# Patient Record
Sex: Male | Born: 1935 | Race: White | Hispanic: No | Smoking: Current every day smoker
Health system: Southern US, Community
[De-identification: ages and names within clinical notes are randomized; demographics above are authoritative.]

## PROBLEM LIST (undated history)

## (undated) DIAGNOSIS — H1710 Central corneal opacity, unspecified eye: Secondary | ICD-10-CM

## (undated) DIAGNOSIS — C719 Malignant neoplasm of brain, unspecified: Secondary | ICD-10-CM

## (undated) DIAGNOSIS — Z9001 Acquired absence of eye: Secondary | ICD-10-CM

## (undated) DIAGNOSIS — C801 Malignant (primary) neoplasm, unspecified: Secondary | ICD-10-CM

## (undated) DIAGNOSIS — N289 Disorder of kidney and ureter, unspecified: Secondary | ICD-10-CM

## (undated) DIAGNOSIS — Z978 Presence of other specified devices: Secondary | ICD-10-CM

## (undated) DIAGNOSIS — M436 Torticollis: Secondary | ICD-10-CM

## (undated) DIAGNOSIS — Z960 Presence of urogenital implants: Secondary | ICD-10-CM

## (undated) DIAGNOSIS — R531 Weakness: Secondary | ICD-10-CM

## (undated) DIAGNOSIS — I739 Peripheral vascular disease, unspecified: Secondary | ICD-10-CM

## (undated) DIAGNOSIS — I10 Essential (primary) hypertension: Secondary | ICD-10-CM

## (undated) DIAGNOSIS — R278 Other lack of coordination: Secondary | ICD-10-CM

## (undated) DIAGNOSIS — E7800 Pure hypercholesterolemia, unspecified: Secondary | ICD-10-CM

## (undated) DIAGNOSIS — I2510 Atherosclerotic heart disease of native coronary artery without angina pectoris: Secondary | ICD-10-CM

## (undated) DIAGNOSIS — I701 Atherosclerosis of renal artery: Secondary | ICD-10-CM

## (undated) HISTORY — DX: Other lack of coordination: R27.8

## (undated) HISTORY — DX: Atherosclerosis of renal artery: I70.1

## (undated) HISTORY — DX: Atherosclerotic heart disease of native coronary artery without angina pectoris: I25.10

## (undated) HISTORY — DX: Central corneal opacity, unspecified eye: H17.10

## (undated) HISTORY — DX: Acquired absence of eye: Z90.01

## (undated) HISTORY — DX: Pure hypercholesterolemia, unspecified: E78.00

## (undated) HISTORY — DX: Malignant neoplasm of brain, unspecified: C71.9

## (undated) HISTORY — DX: Essential (primary) hypertension: I10

## (undated) HISTORY — PX: OTHER SURGICAL HISTORY: SHX169

## (undated) HISTORY — DX: Weakness: R53.1

## (undated) HISTORY — DX: Torticollis: M43.6

## (undated) HISTORY — PX: CORONARY STENT PLACEMENT: SHX1402

## (undated) HISTORY — DX: Disorder of kidney and ureter, unspecified: N28.9

## (undated) HISTORY — DX: Peripheral vascular disease, unspecified: I73.9

## (undated) HISTORY — DX: Malignant (primary) neoplasm, unspecified: C80.1

## (undated) HISTORY — PX: HEMORROIDECTOMY: SUR656

---

## 2005-08-21 ENCOUNTER — Ambulatory Visit: Payer: Self-pay | Admitting: Cardiovascular Disease

## 2005-08-21 ENCOUNTER — Inpatient Hospital Stay (HOSPITAL_COMMUNITY): Admission: EM | Admit: 2005-08-21 | Discharge: 2005-08-24 | Payer: Self-pay | Admitting: Emergency Medicine

## 2005-09-13 ENCOUNTER — Ambulatory Visit: Payer: Self-pay | Admitting: Internal Medicine

## 2006-04-24 ENCOUNTER — Ambulatory Visit: Payer: Self-pay | Admitting: Cardiology

## 2006-11-19 ENCOUNTER — Ambulatory Visit: Payer: Self-pay | Admitting: Cardiology

## 2006-11-19 LAB — CONVERTED CEMR LAB
ALT: 20 units/L (ref 0–40)
Albumin: 4 g/dL (ref 3.5–5.2)
Alkaline Phosphatase: 59 units/L (ref 39–117)
BUN: 28 mg/dL — ABNORMAL HIGH (ref 6–23)
Bilirubin, Direct: 0.1 mg/dL (ref 0.0–0.3)
CO2: 36 meq/L — ABNORMAL HIGH (ref 19–32)
Calcium: 10.6 mg/dL — ABNORMAL HIGH (ref 8.4–10.5)
GFR calc non Af Amer: 40 mL/min
Hemoglobin, Urine: NEGATIVE
Ketones, ur: NEGATIVE mg/dL
Nitrite: NEGATIVE
Potassium: 4.2 meq/L (ref 3.5–5.1)
Specific Gravity, Urine: 1.02 (ref 1.000–1.03)
Total Protein: 6.7 g/dL (ref 6.0–8.3)
Urine Glucose: NEGATIVE mg/dL
Urobilinogen, UA: 0.2 (ref 0.0–1.0)

## 2006-12-03 ENCOUNTER — Ambulatory Visit: Payer: Self-pay

## 2006-12-19 ENCOUNTER — Ambulatory Visit: Payer: Self-pay | Admitting: Cardiology

## 2006-12-19 LAB — CONVERTED CEMR LAB
Basophils Relative: 0.1 % (ref 0.0–1.0)
Calcium: 9.9 mg/dL (ref 8.4–10.5)
Chloride: 106 meq/L (ref 96–112)
Creatinine, Ser: 1.8 mg/dL — ABNORMAL HIGH (ref 0.4–1.5)
Glucose, Bld: 99 mg/dL (ref 70–99)
Monocytes Absolute: 0.7 10*3/uL (ref 0.2–0.7)
Neutrophils Relative %: 63.3 % (ref 43.0–77.0)
Potassium: 4 meq/L (ref 3.5–5.1)
Prothrombin Time: 11.3 s (ref 10.0–14.0)
Sodium: 145 meq/L (ref 135–145)
WBC: 6.8 10*3/uL (ref 4.5–10.5)

## 2006-12-24 ENCOUNTER — Ambulatory Visit: Payer: Self-pay | Admitting: Cardiology

## 2006-12-24 ENCOUNTER — Ambulatory Visit (HOSPITAL_COMMUNITY): Admission: RE | Admit: 2006-12-24 | Discharge: 2006-12-25 | Payer: Self-pay | Admitting: Cardiology

## 2007-01-05 ENCOUNTER — Ambulatory Visit: Payer: Self-pay

## 2007-01-05 ENCOUNTER — Ambulatory Visit: Payer: Self-pay | Admitting: Cardiology

## 2007-01-05 LAB — CONVERTED CEMR LAB
CO2: 29 meq/L (ref 19–32)
Calcium: 10.3 mg/dL (ref 8.4–10.5)
GFR calc Af Amer: 52 mL/min
GFR calc non Af Amer: 43 mL/min
Glucose, Bld: 125 mg/dL — ABNORMAL HIGH (ref 70–99)

## 2007-04-06 ENCOUNTER — Ambulatory Visit: Payer: Self-pay | Admitting: Internal Medicine

## 2007-05-15 ENCOUNTER — Ambulatory Visit: Payer: Self-pay | Admitting: Internal Medicine

## 2007-07-01 ENCOUNTER — Ambulatory Visit: Payer: Self-pay | Admitting: Internal Medicine

## 2007-09-14 ENCOUNTER — Ambulatory Visit: Payer: Self-pay

## 2008-09-22 ENCOUNTER — Ambulatory Visit: Payer: Self-pay

## 2008-10-13 ENCOUNTER — Ambulatory Visit: Payer: Self-pay | Admitting: Internal Medicine

## 2009-05-03 ENCOUNTER — Ambulatory Visit: Payer: Self-pay | Admitting: Internal Medicine

## 2009-05-03 DIAGNOSIS — F17200 Nicotine dependence, unspecified, uncomplicated: Secondary | ICD-10-CM | POA: Insufficient documentation

## 2009-05-03 DIAGNOSIS — E782 Mixed hyperlipidemia: Secondary | ICD-10-CM | POA: Insufficient documentation

## 2009-05-03 DIAGNOSIS — E785 Hyperlipidemia, unspecified: Secondary | ICD-10-CM

## 2009-05-03 DIAGNOSIS — I2510 Atherosclerotic heart disease of native coronary artery without angina pectoris: Secondary | ICD-10-CM

## 2009-05-03 DIAGNOSIS — I10 Essential (primary) hypertension: Secondary | ICD-10-CM

## 2009-05-12 ENCOUNTER — Telehealth (INDEPENDENT_AMBULATORY_CARE_PROVIDER_SITE_OTHER): Payer: Self-pay | Admitting: *Deleted

## 2009-06-02 ENCOUNTER — Encounter: Payer: Self-pay | Admitting: Internal Medicine

## 2009-06-02 LAB — CONVERTED CEMR LAB
ALT: 19 units/L
Albumin: 4.1 g/dL
BUN: 30 mg/dL
Calcium: 10 mg/dL
Creatinine, Ser: 1.74 mg/dL
GFR calc non Af Amer: 39 mL/min
HDL: 43 mg/dL
Potassium: 3.7 meq/L
Total Bilirubin: 0.3 mg/dL
Total Protein: 6.1 g/dL

## 2009-06-08 ENCOUNTER — Encounter: Payer: Self-pay | Admitting: Internal Medicine

## 2009-09-25 ENCOUNTER — Encounter: Payer: Self-pay | Admitting: Internal Medicine

## 2009-09-25 DIAGNOSIS — I714 Abdominal aortic aneurysm, without rupture, unspecified: Secondary | ICD-10-CM | POA: Insufficient documentation

## 2009-09-26 ENCOUNTER — Ambulatory Visit: Payer: Self-pay

## 2009-09-26 ENCOUNTER — Encounter: Payer: Self-pay | Admitting: Internal Medicine

## 2009-12-19 ENCOUNTER — Ambulatory Visit: Payer: Self-pay | Admitting: Internal Medicine

## 2009-12-19 DIAGNOSIS — R930 Abnormal findings on diagnostic imaging of skull and head, not elsewhere classified: Secondary | ICD-10-CM | POA: Insufficient documentation

## 2010-01-03 ENCOUNTER — Telehealth: Payer: Self-pay | Admitting: Internal Medicine

## 2010-01-11 ENCOUNTER — Encounter: Payer: Self-pay | Admitting: Internal Medicine

## 2010-01-15 ENCOUNTER — Ambulatory Visit: Payer: Self-pay | Admitting: Internal Medicine

## 2010-01-16 LAB — CONVERTED CEMR LAB
BUN: 35 mg/dL — ABNORMAL HIGH (ref 6–23)
CO2: 26 meq/L (ref 19–32)
Calcium: 10.3 mg/dL (ref 8.4–10.5)
Chloride: 104 meq/L (ref 96–112)
Creatinine, Ser: 1.7 mg/dL — ABNORMAL HIGH (ref 0.40–1.50)
Glucose, Bld: 132 mg/dL — ABNORMAL HIGH (ref 70–99)
Sodium: 141 meq/L (ref 135–145)

## 2010-01-17 ENCOUNTER — Encounter: Payer: Self-pay | Admitting: Internal Medicine

## 2010-01-23 ENCOUNTER — Telehealth: Payer: Self-pay | Admitting: Internal Medicine

## 2010-01-24 ENCOUNTER — Ambulatory Visit: Payer: Self-pay | Admitting: Internal Medicine

## 2010-01-31 ENCOUNTER — Encounter: Payer: Self-pay | Admitting: Internal Medicine

## 2010-01-31 LAB — COMPREHENSIVE METABOLIC PANEL
ALT: 11 U/L (ref 0–53)
Albumin: 4.1 g/dL (ref 3.5–5.2)
CO2: 30 mEq/L (ref 19–32)
Glucose, Bld: 71 mg/dL (ref 70–99)
Potassium: 4 mEq/L (ref 3.5–5.3)
Sodium: 144 mEq/L (ref 135–145)
Total Bilirubin: 0.6 mg/dL (ref 0.3–1.2)
Total Protein: 6.5 g/dL (ref 6.0–8.3)

## 2010-01-31 LAB — CBC WITH DIFFERENTIAL/PLATELET
BASO%: 0.3 % (ref 0.0–2.0)
Eosinophils Absolute: 0.5 10*3/uL (ref 0.0–0.5)
LYMPH%: 18.3 % (ref 14.0–49.0)
MCHC: 34 g/dL (ref 32.0–36.0)
MONO#: 0.8 10*3/uL (ref 0.1–0.9)
NEUT#: 4.8 10*3/uL (ref 1.5–6.5)
Platelets: 193 10*3/uL (ref 140–400)
RBC: 4.39 10*6/uL (ref 4.20–5.82)
RDW: 13.8 % (ref 11.0–14.6)
WBC: 7.4 10*3/uL (ref 4.0–10.3)
lymph#: 1.3 10*3/uL (ref 0.9–3.3)

## 2010-02-04 ENCOUNTER — Encounter: Admission: RE | Admit: 2010-02-04 | Discharge: 2010-02-04 | Payer: Self-pay | Admitting: Internal Medicine

## 2010-02-07 ENCOUNTER — Ambulatory Visit (HOSPITAL_COMMUNITY): Admission: RE | Admit: 2010-02-07 | Discharge: 2010-02-07 | Payer: Self-pay | Admitting: Internal Medicine

## 2010-02-12 ENCOUNTER — Encounter: Payer: Self-pay | Admitting: Internal Medicine

## 2010-02-12 LAB — CBC WITH DIFFERENTIAL/PLATELET
BASO%: 0.2 % (ref 0.0–2.0)
LYMPH%: 20.2 % (ref 14.0–49.0)
MCH: 32.3 pg (ref 27.2–33.4)
MCHC: 34.2 g/dL (ref 32.0–36.0)
MCV: 94.5 fL (ref 79.3–98.0)
MONO%: 9 % (ref 0.0–14.0)
NEUT%: 64.6 % (ref 39.0–75.0)
Platelets: 206 10*3/uL (ref 140–400)
RBC: 4.3 10*6/uL (ref 4.20–5.82)

## 2010-02-12 LAB — COMPREHENSIVE METABOLIC PANEL
ALT: 13 U/L (ref 0–53)
Alkaline Phosphatase: 57 U/L (ref 39–117)
Creatinine, Ser: 1.86 mg/dL — ABNORMAL HIGH (ref 0.40–1.50)
Sodium: 141 mEq/L (ref 135–145)
Total Bilirubin: 0.7 mg/dL (ref 0.3–1.2)
Total Protein: 6.8 g/dL (ref 6.0–8.3)

## 2010-02-21 ENCOUNTER — Ambulatory Visit: Payer: Self-pay | Admitting: Thoracic Surgery

## 2010-02-22 ENCOUNTER — Telehealth: Payer: Self-pay | Admitting: Internal Medicine

## 2010-02-26 ENCOUNTER — Ambulatory Visit: Admission: RE | Admit: 2010-02-26 | Discharge: 2010-02-26 | Payer: Self-pay | Admitting: Family Medicine

## 2010-02-27 ENCOUNTER — Ambulatory Visit: Payer: Self-pay | Admitting: Thoracic Surgery (Cardiothoracic Vascular Surgery)

## 2010-02-27 ENCOUNTER — Encounter: Payer: Self-pay | Admitting: Internal Medicine

## 2010-04-10 ENCOUNTER — Ambulatory Visit: Payer: Self-pay | Admitting: Thoracic Surgery (Cardiothoracic Vascular Surgery)

## 2010-04-10 ENCOUNTER — Inpatient Hospital Stay (HOSPITAL_COMMUNITY)
Admission: RE | Admit: 2010-04-10 | Discharge: 2010-04-23 | Payer: Self-pay | Admitting: Thoracic Surgery (Cardiothoracic Vascular Surgery)

## 2010-04-10 ENCOUNTER — Encounter: Payer: Self-pay | Admitting: Thoracic Surgery (Cardiothoracic Vascular Surgery)

## 2010-04-10 HISTORY — PX: BRONCHOSCOPY: SUR163

## 2010-04-10 HISTORY — PX: OTHER SURGICAL HISTORY: SHX169

## 2010-04-13 ENCOUNTER — Encounter: Payer: Self-pay | Admitting: Internal Medicine

## 2010-05-17 ENCOUNTER — Ambulatory Visit: Payer: Self-pay | Admitting: Thoracic Surgery (Cardiothoracic Vascular Surgery)

## 2010-05-17 ENCOUNTER — Encounter: Payer: Self-pay | Admitting: Internal Medicine

## 2010-05-17 ENCOUNTER — Encounter
Admission: RE | Admit: 2010-05-17 | Discharge: 2010-05-17 | Payer: Self-pay | Admitting: Thoracic Surgery (Cardiothoracic Vascular Surgery)

## 2010-06-07 ENCOUNTER — Ambulatory Visit: Payer: Self-pay | Admitting: Internal Medicine

## 2010-07-16 ENCOUNTER — Ambulatory Visit: Payer: Self-pay | Admitting: Thoracic Surgery (Cardiothoracic Vascular Surgery)

## 2010-07-16 ENCOUNTER — Encounter
Admission: RE | Admit: 2010-07-16 | Discharge: 2010-07-16 | Payer: Self-pay | Admitting: Thoracic Surgery (Cardiothoracic Vascular Surgery)

## 2010-09-10 ENCOUNTER — Telehealth: Payer: Self-pay | Admitting: Internal Medicine

## 2010-09-22 ENCOUNTER — Other Ambulatory Visit: Payer: Self-pay | Admitting: Thoracic Surgery (Cardiothoracic Vascular Surgery)

## 2010-09-22 DIAGNOSIS — C3490 Malignant neoplasm of unspecified part of unspecified bronchus or lung: Secondary | ICD-10-CM

## 2010-09-24 ENCOUNTER — Encounter: Payer: Self-pay | Admitting: Internal Medicine

## 2010-09-25 ENCOUNTER — Ambulatory Visit: Admission: RE | Admit: 2010-09-25 | Discharge: 2010-09-25 | Payer: Self-pay | Source: Home / Self Care

## 2010-09-25 ENCOUNTER — Encounter: Payer: Self-pay | Admitting: Internal Medicine

## 2010-10-02 NOTE — Letter (Signed)
Summary: [facility]  [facility]   Imported By: Marylou Mccoy 04/16/2010 10:18:03  _____________________________________________________________________  External Attachment:    Type:   Image     Comment:   External Document

## 2010-10-02 NOTE — Progress Notes (Signed)
Summary: Phone Call   Phone Note Call from Patient Call back at Home Phone [phone]   Caller: Patient Call For: rn Summary of Call: Patient requested that Dr. Prescott Gum RN give him a call. Initial call taken by: [place],  Jan 03, 2010 3:17 PM  Follow-up for Phone Call        Attempted TCB pt.  LMOM TCB. Cloyde Reams RN  Jan 03, 2010 3:48 PM  Called spoke with pt.  Pt is still awaiting phone call from Dr Gala Romney reguarding chest CT.  Was told he would call and discuss results and recommendations with pt, but pt hasn't heard from Dr Gala Romney.  Please call pt.  Thanks. Follow-up by: Cloyde Reams RN,  Jan 05, 2010 12:32 PM  Additional Follow-up for Phone Call Additional follow up Details #1::        Dr Gala Romney discussed with pt.  See append 12/19/09 chest image. Additional Follow-up by: Cloyde Reams RN,  Jan 08, 2010 2:24 PM

## 2010-10-02 NOTE — Letter (Signed)
Summary: [facility] Office Visit   [facility] Office Visit   Imported By: Roderic Ovens 06/26/2010 14:16:57  _____________________________________________________________________  External Attachment:    Type:   Image     Comment:   External Document

## 2010-10-02 NOTE — Progress Notes (Signed)
Summary: Message   Phone Note Call from Patient Call back at Home Phone [phone]   Caller: Patient Call For: Ajahnae Rathgeber Summary of Call: Mr. Petruzzi would like for Dr. Gala Romney to call him today, he would not give me any details about what it was concerning.  He said that it was very important that Dr. Gala Romney call him sometime today. Initial call taken by: [place],  February 22, 2010 9:42 AM  Follow-up for Phone Call        I will call him now  Dolores Patty, MD, [facility]  February 22, 2010 6:02 PM

## 2010-10-02 NOTE — Miscellaneous (Signed)
Summary: Orders Update  Clinical Lists Changes  Problems: Added new problem of ABDOMINAL AORTIC ANEURYSM (ICD-441.4) Orders: Added new Test order of Abdominal Aorta Duplex (Abd Aorta Duplex) - Signed 

## 2010-10-02 NOTE — Miscellaneous (Signed)
Summary: Orders Update  Clinical Lists Changes  Orders: Added new Referral order of CT Scan  (CT Scan) - Signed  Appended Document: Orders Update    Clinical Lists Changes  Orders: Added new Referral order of CT Scan  (CT Scan) - Signed

## 2010-10-02 NOTE — Progress Notes (Signed)
Summary: CT SCAN RESULTS  Phone Note Call from Patient   Caller: Patient Call For: Bensimhon Summary of Call: Pt calling requesting results of CT scan done on 01/17/10 and scanned into EMR.  Results in chart unsigned.  Pt would like a call back this afternoon after 4pm with the results.  Advised pt I would send a not to Dr Gala Romney and I would call pt back with results.   Initial call taken by: Cloyde Reams RN,  Jan 23, 2010 12:28 PM  Follow-up for Phone Call        I spoke with him today and told him he has lung cancer which looks like it is spreading. has agreed to go see oncology. He would like [place] to make him appt with them on a mon or wed so his daughter can go with him. Cicero Duck can you please arrange and get back to him with appt? Thanks. Dolores Patty, MD, [facility]  Jan 23, 2010 5:59 PM   Additional Follow-up for Phone Call Additional follow up Details #1::        Called [facility] and spoke with Renee new pt coordinator.  She requested records be faxed to [phone] and she will have Dr Gwenyth Bouillon review, and then will contact pt with appt date and time.  Faxed last OV note, CXR and CT reports, as well as noted on fax cover sheet pt needs appt on Mon or Wed so daughter can be present at OV. Additional Follow-up by: Cloyde Reams RN,  Jan 24, 2010 11:37 AM    Additional Follow-up for Phone Call Additional follow up Details #2::    Consult appt with Dr Arbutus Ped on 01/31/10 at 1:00.  Pt aware. Follow-up by: Cloyde Reams RN,  Jan 26, 2010 9:40 AM

## 2010-10-02 NOTE — Letter (Signed)
[facility]   Imported By: Roderic Ovens 04/24/2010 10:20:59  _____________________________________________________________________  External Attachment:    Type:   Image     Comment:   External Document

## 2010-10-02 NOTE — Letter (Signed)
[facility]   Imported By: Marylou Mccoy 03/21/2010 18:30:04  _____________________________________________________________________  External Attachment:    Type:   Image     Comment:   External Document

## 2010-10-02 NOTE — Consult Note (Signed)
Summary: [facility] New Patient Evaluation  [facility] New [facility]   Imported By: Roderic Ovens 02/23/2010 10:20:08  _____________________________________________________________________  External Attachment:    Type:   Image     Comment:   External Document

## 2010-10-02 NOTE — Assessment & Plan Note (Signed)
Summary: F6M/AMD  Medications Added CARDURA 1 MG TABS (DOXAZOSIN MESYLATE) 1 tab at bedtime      Allergies Added:   Visit Type:  Follow-up Referring Provider:  Dr. Arvilla Meres Primary Provider:  Dewaine Oats, MD  CC:  6 month checkup/No complaint.  History of Present Illness: [name] is a very pleasant 76 year old male with a history of coronary artery disease, status post Cypher drug-eluting stent of the left circumflex in December 2006.  He also has hypertension, renal artery stenosis, status post renal artery stent in April 2008, COPD with ongoing tobacco use, and protocol as he returns today for routine followup.   In September 2010 had CXR which showed a 4.5 cm RUL opacity which was concerning for malignancy. We ordered CT but he cancelled.  Overall doing fairly well. Denies CP or significant dyspnea. No weight loss or night sweats. No hempotysis. Has been taking BP at home with systolics 140-150 range. Says he is afraid to eat stuff he likes b/c he is worried it will drive his BP yet.  Still smoking about 2-3 packs/week.  Current Medications (verified): 1)  Simvastatin 40 Mg Tabs (Simvastatin) .... Take One Tablet By Mouth Daily At Bedtime 2)  Quinapril Hcl 40 Mg Tabs (Quinapril Hcl) .Marland Kitchen.. 1 By Mouth Two Times A Day 3)  Amlodipine Besylate 10 Mg Tabs (Amlodipine Besylate) .... Take One Tablet By Mouth Daily 4)  Hydrochlorothiazide 25 Mg Tabs (Hydrochlorothiazide) .... Take One Tablet By Mouth Daily. 5)  Tekturna 300 Mg Tabs (Aliskiren Fumarate) .Marland Kitchen.. 1 By Mouth Once Daily 6)  Plavix 75 Mg Tabs (Clopidogrel Bisulfate) .... Take One Tablet By Mouth Daily 7)  Carvedilol 25 Mg Tabs (Carvedilol) .... Take One Tablet By Mouth Twice A Day 8)  Aspirin 81 Mg Tbec (Aspirin) .... Take One Tablet By Mouth Daily 9)  Clonidine Hcl 0.3 Mg Tabs (Clonidine Hcl) .Marland Kitchen.. 1 By Mouth Two Times A Day  Allergies (verified): 1)  ! Sulfa  Past History:  Past Medical History:  1. Coronary disease      --s/p Cypher drug eluting stent to LCX for unstable angina in 2006.  2. Renal artery stenosis        --s/p L renal artery stent 4/08  3. COPD with ongoing tobacco abuse.  4. Hypertension.  5. Hypercholesterolemia.  6. Renal insufficiency. Baseline Cr 1.5-1.8  7. Lower extremity peripheral arterial disease.  8. 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  9. Torticollis 10. RUL opacity on CXR 9/10 - refused CT  Social History: Married. Smokes at least 4 to 5 cigarettes  per day, as he has for more than 40 years.  Denies alcohol and illicit drug use.  Review of Systems       As per HPI and past medical history; otherwise all systems negative.   Vital Signs:  Patient profile:   76 year old male Height:      70 inches Weight:      129 pounds BMI:     18.58 Pulse rate:   83 / minute BP sitting:   170 / 98  (left arm) Cuff size:   large  Vitals Entered By: Hardin Negus, RMA (December 19, 2009 3:08 PM)  Serial Vital Signs/Assessments:  Time      Position  BP       Pulse  Resp  Temp     By 3:18                192/109  Charlena Cross, RN, BSN   Physical Exam  General:  He is very thin and in no acute distress.  He ambulates in the clinic without any respiratory difficulty. HEENT:  Normal except for his torticollis. NECK:  Supple.  No JVD.  Carotids are distant 1+ bilaterally without any obvious bruits.  No lymphadenopathy or thyromegaly.   CARDIAC:  PMI is nondisplaced.  He is regular with an S4.  No murmur. LUNGS:  Decreased air movement throughout, but otherwise clear. ABDOMEN:  Soft, nontender, and nondistended.  No hepatosplenomegaly.  No bruits.  No masses. EXTREMITIES:  Warm with no cyanosis, clubbing, or edema. NEURO:  Alert and oriented x3.  Cranial nerves are intact except for his torticollis.  He moves all 4 extremities without difficulty.  Affect is pleasant.    Impression & Recommendations:  Problem # 1:  CAD, NATIVE VESSEL  (ICD-414.01)  Stable. No evidence of ischemia. Continue current regimen.  His updated medication list for this problem includes:    Quinapril Hcl 40 Mg Tabs (Quinapril hcl) .Marland Kitchen... 1 by mouth two times a day    Amlodipine Besylate 10 Mg Tabs (Amlodipine besylate) .Marland Kitchen... Take one tablet by mouth daily    Plavix 75 Mg Tabs (Clopidogrel bisulfate) .Marland Kitchen... Take one tablet by mouth daily    Carvedilol 25 Mg Tabs (Carvedilol) .Marland Kitchen... Take one tablet by mouth twice a day    Aspirin 81 Mg Tbec (Aspirin) .Marland Kitchen... Take one tablet by mouth daily  Problem # 2:  ABNORMAL CHEST X-RAY (ICD-793.1) Very suspicious for cancer. Not very interesed in CT at this time. Will recheck CXR. If still abnormal will try to push him to f/u with CT.  Problem # 3:  HYPERTENSION, BENIGN (ICD-401.1) SBP well over 200 on presentation today. Came down to 170. Will start cardura 1mg  at bedtime.repeat renal u/s to make sure renal aretery stent is patent.  Problem # 4:  TOBACCO ABUSE (ICD-305.1) Counseled on need to quit.  Problem # 5:  ABDOMINAL AORTIC ANEURYSM (ICD-441.4) Will do f/u ultrasound to measure.   Other Orders: Abdominal Aorta Duplex (Abd Aorta Duplex) T-2 View CXR (71020TC)  Patient Instructions: 1)  Your physician recommends that you schedule a follow-up appointment in: 6 months 2)  Your physician has recommended you make the following change in your medication: start cardura 1 mg at bedtime 3)  A chest x-ray takes a picture of the organs and structures inside the chest, including the heart, lungs, and blood vessels. This test can show several things, including, whether the heart is enlarged; whether fluid is building up in the lungs; and whether pacemaker / defibrillator leads are still in place. 4)  Your physician has requested that you have an abdominal aorta duplex. During this test, an ultrasound is used to evaluate the aorta. Allow 30 minutes for this exam. Do not eat after midnight the day before and avoid  carbonated beverages. There are no restrictions or special instructions. Prescriptions: CARDURA 1 MG TABS (DOXAZOSIN MESYLATE) 1 tab at bedtime  #30 x 6   Entered by:   Charlena Cross, RN, BSN   Authorized by:   Dolores Patty, MD, [facility]   Signed by:   Charlena Cross, RN, BSN on 12/19/2009   Method used:   Electronically to        [facility]* (retail)       [address]       [place], [place]  [address]       Ph: [phone]  Fax: [phone]   RxID:   [id number]

## 2010-10-04 ENCOUNTER — Ambulatory Visit: Payer: Self-pay | Admitting: Thoracic Surgery (Cardiothoracic Vascular Surgery)

## 2010-10-04 ENCOUNTER — Ambulatory Visit: Admit: 2010-10-04 | Payer: Self-pay | Admitting: Thoracic Surgery (Cardiothoracic Vascular Surgery)

## 2010-10-04 ENCOUNTER — Other Ambulatory Visit: Payer: Self-pay

## 2010-10-04 NOTE — Progress Notes (Signed)
Summary: pt calling re order for ultrasound   Phone Note Call from Patient   Caller: Patient [phone] Reason for Call: Talk to Nurse Summary of Call: pt calling about having an [place] -he has them every year Initial call taken by: Glynda Jaeger,  September 10, 2010 9:25 AM  Follow-up for Phone Call        [facility] will contact pt to sch Meredith Staggers, RN  September 10, 2010 9:38 AM

## 2010-10-04 NOTE — Miscellaneous (Signed)
Summary: Orders Update  Clinical Lists Changes  Orders: Added new Test order of Abdominal Aorta Duplex (Abd Aorta Duplex) - Signed 

## 2010-10-10 ENCOUNTER — Encounter: Payer: Self-pay | Admitting: Internal Medicine

## 2010-10-18 ENCOUNTER — Ambulatory Visit
Admission: RE | Admit: 2010-10-18 | Discharge: 2010-10-18 | Disposition: A | Discharge status: Home / Self Care | Payer: Medicare Other | Source: Ambulatory Visit | Attending: Thoracic Surgery (Cardiothoracic Vascular Surgery) | Admitting: Thoracic Surgery (Cardiothoracic Vascular Surgery)

## 2010-10-18 ENCOUNTER — Ambulatory Visit (INDEPENDENT_AMBULATORY_CARE_PROVIDER_SITE_OTHER): Payer: Medicare Other | Admitting: Thoracic Surgery (Cardiothoracic Vascular Surgery)

## 2010-10-18 DIAGNOSIS — C3490 Malignant neoplasm of unspecified part of unspecified bronchus or lung: Secondary | ICD-10-CM

## 2010-10-18 DIAGNOSIS — D381 Neoplasm of uncertain behavior of trachea, bronchus and lung: Secondary | ICD-10-CM

## 2010-10-18 NOTE — Miscellaneous (Signed)
  Clinical Lists Changes  Orders: Added new Test order of Abdominal Aorta Duplex (Abd Aorta Duplex) - Signed

## 2010-10-19 NOTE — Assessment & Plan Note (Addendum)
OFFICE VISIT  GLENDA, KUNST DOB:  1934/09/18                                        October 18, 2010 CHART #:  [id number]  REASON FOR VISIT:  A 23-month follow-up after resection for lung cancer.  HISTORY OF PRESENT ILLNESS:  The patient is a 76 year old gentleman who had a right upper lobe mass 4.5 cm in diameter.  He underwent a right upper lobectomy, mediastinal node dissection in August 2011.  This turned out to be a stage IB cancer.  He was offered adjuvant chemotherapy but declined.  He was last seen in the office in November, at which time, he was doing well, now returns for 51-month followup with a CT scan.  He states he has really been feeling well.  He has not had any cough or hemoptysis.  His breathing is stable.  He has not had seen Dr. Gala Romney recently regarding any heart issues.  Overall, he feels well.  MEDICATIONS:  Unchanged from his last visit.  ALLERGIES:  SULFA.  No change in family or social history.  PHYSICAL EXAMINATION:  General:  The patient is a thin 76 year old gentleman in no acute distress.  Vital Signs:  Blood pressure 145/86, pulse 84, respirations are 20, ox saturation is 97% on room air.  His general appearance is remarkable for torticollis.  There is no cervical or supraclavicular adenopathy.  LUNGS:  Distant breath sounds bilaterally.  Incisions are well healed.  CT of the chest was reviewed.  It has not been officially read yet and is compared to his PET scan from June 2008.  In comparing the 2 films, the ground-glass opacity in the left upper lobe is questionably increased in size.  It may be a difference in technique.  It had previously measured 11.8 mm on the PET scan, but it is like now approximately 13 mm; otherwise, has not changed in character.  IMPRESSION:  The patient is now 6 months out from right upper lobectomy and node dissection for a stage IB non-small cell cancer.  He has no evidence of  recurrent disease.  He does have a ground-glass opacity. There were actually 3 of them, the largest in the left upper lobe that were present prior to his surgery and were not hypermetabolic on PET scan.  There has been questionable enlargement of the lesion in the left upper lobe.  I will wait and see what the official radiology reading is; however, even if it has increased in size, it has been relatively minimal, it appear to be relatively slow growing.  I recommended to him that we repeat a scan in 3 months to see if there has been any definitive change in size.  If there is then we could discuss diagnostic options.  Salvatore Decent Dorris Fetch, M.D. Electronically Signed  SCH/MEDQ  D:  10/18/2010  T:  10/19/2010  Job:  [id number]  cc:   Bevelyn Buckles. Bensimhon, MD Dewaine Oats

## 2010-11-16 LAB — COMPREHENSIVE METABOLIC PANEL
AST: 17 U/L (ref 0–37)
AST: 24 U/L (ref 0–37)
Albumin: 3.8 g/dL (ref 3.5–5.2)
BUN: 23 mg/dL (ref 6–23)
CO2: 27 mEq/L (ref 19–32)
Calcium: 9.1 mg/dL (ref 8.4–10.5)
Chloride: 102 mEq/L (ref 96–112)
Creatinine, Ser: 1.77 mg/dL — ABNORMAL HIGH (ref 0.4–1.5)
Creatinine, Ser: 1.83 mg/dL — ABNORMAL HIGH (ref 0.4–1.5)
GFR calc Af Amer: 44 mL/min — ABNORMAL LOW (ref >60)
GFR calc Af Amer: 46 mL/min — ABNORMAL LOW (ref >60)
GFR calc non Af Amer: 37 mL/min — ABNORMAL LOW (ref >60)
Glucose, Bld: 130 mg/dL — ABNORMAL HIGH (ref 70–99)
Total Bilirubin: 0.6 mg/dL (ref 0.3–1.2)
Total Bilirubin: 0.7 mg/dL (ref 0.3–1.2)

## 2010-11-16 LAB — URINALYSIS, ROUTINE W REFLEX MICROSCOPIC
Bilirubin Urine: NEGATIVE
Hgb urine dipstick: NEGATIVE
Protein, ur: NEGATIVE mg/dL
Urobilinogen, UA: 0.2 mg/dL (ref 0.0–1.0)

## 2010-11-16 LAB — CBC
HCT: 29.9 % — ABNORMAL LOW (ref 39.0–52.0)
HCT: 34.6 % — ABNORMAL LOW (ref 39.0–52.0)
Hemoglobin: 10.2 g/dL — ABNORMAL LOW (ref 13.0–17.0)
Hemoglobin: 10.7 g/dL — ABNORMAL LOW (ref 13.0–17.0)
Hemoglobin: 12 g/dL — ABNORMAL LOW (ref 13.0–17.0)
MCH: 31.3 pg (ref 26.0–34.0)
MCH: 31.6 pg (ref 26.0–34.0)
MCH: 31.8 pg (ref 26.0–34.0)
MCH: 32 pg (ref 26.0–34.0)
MCHC: 34.1 g/dL (ref 30.0–36.0)
MCHC: 34.5 g/dL (ref 30.0–36.0)
MCHC: 35.3 g/dL (ref 30.0–36.0)
MCV: 89.9 fL (ref 78.0–100.0)
MCV: 90.3 fL (ref 78.0–100.0)
MCV: 91.6 fL (ref 78.0–100.0)
MCV: 91.7 fL (ref 78.0–100.0)
Platelets: 198 10*3/uL (ref 150–400)
Platelets: 211 10*3/uL (ref 150–400)
Platelets: 221 10*3/uL (ref 150–400)
RBC: 3.1 MIL/uL — ABNORMAL LOW (ref 4.22–5.81)
RBC: 3.26 MIL/uL — ABNORMAL LOW (ref 4.22–5.81)
RBC: 4.44 MIL/uL (ref 4.22–5.81)
RDW: 13.4 % (ref 11.5–15.5)
RDW: 13.5 % (ref 11.5–15.5)
RDW: 13.6 % (ref 11.5–15.5)
WBC: 6.7 10*3/uL (ref 4.0–10.5)
WBC: 8.2 10*3/uL (ref 4.0–10.5)
WBC: 8.9 10*3/uL (ref 4.0–10.5)

## 2010-11-16 LAB — BASIC METABOLIC PANEL
BUN: 24 mg/dL — ABNORMAL HIGH (ref 6–23)
BUN: 36 mg/dL — ABNORMAL HIGH (ref 6–23)
BUN: 37 mg/dL — ABNORMAL HIGH (ref 6–23)
BUN: 39 mg/dL — ABNORMAL HIGH (ref 6–23)
BUN: 42 mg/dL — ABNORMAL HIGH (ref 6–23)
CO2: 26 mEq/L (ref 19–32)
CO2: 27 mEq/L (ref 19–32)
CO2: 28 mEq/L (ref 19–32)
Calcium: 10.5 mg/dL (ref 8.4–10.5)
Calcium: 9.8 mg/dL (ref 8.4–10.5)
Chloride: 102 mEq/L (ref 96–112)
Chloride: 103 mEq/L (ref 96–112)
Chloride: 103 mEq/L (ref 96–112)
Chloride: 103 mEq/L (ref 96–112)
Chloride: 104 mEq/L (ref 96–112)
Chloride: 105 mEq/L (ref 96–112)
Creatinine, Ser: 1.68 mg/dL — ABNORMAL HIGH (ref 0.4–1.5)
Creatinine, Ser: 1.81 mg/dL — ABNORMAL HIGH (ref 0.4–1.5)
Creatinine, Ser: 1.83 mg/dL — ABNORMAL HIGH (ref 0.4–1.5)
Creatinine, Ser: 1.91 mg/dL — ABNORMAL HIGH (ref 0.4–1.5)
GFR calc Af Amer: 44 mL/min — ABNORMAL LOW (ref >60)
GFR calc Af Amer: 45 mL/min — ABNORMAL LOW (ref >60)
GFR calc Af Amer: 46 mL/min — ABNORMAL LOW (ref >60)
GFR calc non Af Amer: 37 mL/min — ABNORMAL LOW (ref >60)
GFR calc non Af Amer: 38 mL/min — ABNORMAL LOW (ref >60)
Glucose, Bld: 113 mg/dL — ABNORMAL HIGH (ref 70–99)
Glucose, Bld: 121 mg/dL — ABNORMAL HIGH (ref 70–99)
Glucose, Bld: 133 mg/dL — ABNORMAL HIGH (ref 70–99)
Glucose, Bld: 134 mg/dL — ABNORMAL HIGH (ref 70–99)
Potassium: 3 mEq/L — ABNORMAL LOW (ref 3.5–5.1)
Potassium: 4 mEq/L (ref 3.5–5.1)
Potassium: 4 mEq/L (ref 3.5–5.1)
Potassium: 4.4 mEq/L (ref 3.5–5.1)
Sodium: 137 mEq/L (ref 135–145)
Sodium: 138 mEq/L (ref 135–145)

## 2010-11-16 LAB — BLOOD GAS, ARTERIAL
Acid-Base Excess: 3.4 mmol/L — ABNORMAL HIGH (ref 0.0–2.0)
Bicarbonate: 27.1 mEq/L — ABNORMAL HIGH (ref 20.0–24.0)
Drawn by: 1181601
FIO2: 0.21 %
O2 Saturation: 97 %
O2 Saturation: 97.5 %
pCO2 arterial: 40.8 mmHg (ref 35.0–45.0)
pH, Arterial: 7.438 (ref 7.350–7.450)
pO2, Arterial: 79.7 mmHg — ABNORMAL LOW (ref 80.0–100.0)
pO2, Arterial: 89.2 mmHg (ref 80.0–100.0)

## 2010-11-16 LAB — SURGICAL PCR SCREEN: Staphylococcus aureus: NEGATIVE

## 2010-11-16 LAB — TYPE AND SCREEN
ABO/RH(D): A POS
Antibody Screen: NEGATIVE

## 2010-11-16 LAB — FUNGUS CULTURE W SMEAR

## 2010-11-16 LAB — APTT: aPTT: 31 seconds (ref 24–37)

## 2010-11-19 LAB — GLUCOSE, CAPILLARY: Glucose-Capillary: 125 mg/dL — ABNORMAL HIGH (ref 70–99)

## 2010-12-07 ENCOUNTER — Other Ambulatory Visit: Payer: Self-pay | Admitting: Thoracic Surgery (Cardiothoracic Vascular Surgery)

## 2010-12-07 DIAGNOSIS — J984 Other disorders of lung: Secondary | ICD-10-CM

## 2011-01-15 NOTE — Assessment & Plan Note (Signed)
[facility] OFFICE NOTE   Rodney, Lynch                       MRN:          [id number]  DATE:07/01/2007                            DOB:          07-13-1935    PRIMARY CARE PHYSICIAN:  Dewaine Oats, MD.   INTERVAL HISTORY:  [name] is a very pleasant, 76 year old male with  a history of coronary artery disease status post Cypher drug-eluting  stenting of the left circumflex in December 2006. He also has  hypertension, renal artery stenosis status post renal artery stent in  April 2008, COPD with ongoing tobacco use and torticollis. He returns  today for routine followup.   He is doing quite well. He denies any chest pain or shortness of breath.  He has been keeping a good track of his blood pressures and systolics  having been averaging in the 100-120 range. He denies any orthopnea or  PND, no lower extremity edema. He cut his cigarette use down to 3 packs  per week.   CURRENT MEDICATIONS:  1. Aspirin 325 a day.  2. Accupril 40 b.i.d.  3. Norvasc 10 a day.  4. Plavix 75 a day.  5. HCTZ 25 a day.  6. Simvastatin 40 a day.  7. Coreg 25 b.i.d.  8. Tekturna 300 a day.  9. Clonidine 0.2 b.i.d.   PHYSICAL EXAMINATION:  GENERAL:  He is in no acute distress. He  ambulates around the clinic without any respiratory difficulty.  VITAL SIGNS:  Blood pressure here is 150/90, heart rate 78, weight is  139.  HEENT:  Normal except for his torticollis.  NECK:  Supple. There is no JVD. Carotids are distant but 1+ bilaterally  without any obvious bruits. No lymphadenopathy or thyromegaly.  CARDIAC:  PMI is nondisplaced. He has distant heart sounds, regular rate  and rhythm with an S4, no murmur.  LUNGS:  Clear with decreased air movement throughout.  ABDOMEN:  Soft, nontender, nondistended. No hepatosplenomegaly, no  bruits, no masses, good bowel sounds.  EXTREMITIES:  Warm with no cyanosis, clubbing or edema.  No ulceration.  NEUROLOGIC:  Alert and oriented x3. Cranial nerves II-XII are intact  except for his torticollis. He moves all 4 extremities without  difficulty. Affect is pleasant.   ASSESSMENT/PLAN:  1. Hypertension. Blood pressure is elevated here but well controlled      at home. Will just continue current therapy.  2. Coronary artery disease status post previous stenting. This is      stable, no evidence of ongoing ischemia.  3. Hyperlipidemia. Most recent LDL was at goal of 47. Continue statin.  4. Tobacco use. We once again discussed the need to quit.     Bevelyn Buckles. Bensimhon, MD  Electronically Signed    DRB/MedQ  DD: 07/01/2007  DT: 07/01/2007  Job #: [id number]   cc:   Dewaine Oats

## 2011-01-15 NOTE — Assessment & Plan Note (Signed)
[facility] OFFICE NOTE   [name], [name]                       MRN:          [id number]  DATE:05/15/2007                            DOB:          1935/04/10    PRIMARY CARE PHYSICIAN:  Dr. Dewaine Oats.   INTERVAL HISTORY:  Mr. Arneson is a very pleasant 76 year old male with  a history of coronary artery disease, status post stenting of his left  circumflex with a CYPHER drug-eluting stent in December 2006.  He also  has severe hypertension, renal artery stenosis status post renal artery  stent April 2008, COPD with ongoing tobacco use and torticollis.  He  returns today for a routine followup.   Since we last saw him, he had an ultrasound of his kidneys which showed  a widely patent renal artery stent.  We did increase his clonidine at  the last visit.  He said it has made him feel weak and it has been a bit  hard to tolerate but he has been compliant with it.  When he takes his  blood pressure at home it is usually in the 135 range.  He denies chest  pain or shortness of breath.  He is still fairly active.  He continues  to smoke 3 packs of cigarettes a week.   CURRENT MEDICATIONS:  1. Aspirin 325.  2. Accupril 40 b.i.d.  3. Norvasc 10 a day.  4. Plavix 75 a day.  5. HCTZ 25 a day.  6. Simvastatin 40 a day.  7. Coreg 25 a day.  8. Tekturna 300 a day.  9. Clonidine 0.2 b.i.d.   PHYSICAL EXAMINATION:  VITAL SIGNS:  Blood pressure 140/78, heart rate  76, weight 135.  GENERAL:  He ambulates around the clinic without any respiratory  difficulty.  He is in no acute distress.  HEENT:  He has significant torticollis.  He also has previous right eye  enucleation with implant, otherwise normal.  NECK:  There is no JVD.  Carotids are 2 plus bilaterally without bruits.  There is no lymphadenopathy or thyromegaly.  LUNGS:  Clear.  CARDIAC:  PMI is nondisplaced.  He has a regular rate and rhythm with an  S4.   No murmur.  ABDOMEN:  Soft, nontender, nondistended.  No hepatosplenomegaly.  No  bruits.  No masses.  Good bowel sounds.  EXTREMITIES:  Warm with no  cyanosis, clubbing, or edema.  No ulceration.  NEUROLOGIC:  He is alert and oriented x3.  Cranial nerves II-XII are  intact, except for his torticollis.  He moves all 4 extremities without  difficulty.  Affect is pleasant.  SKIN:  He has multiple tattoos.   ASSESSMENT/PLAN:  1. Coronary artery disease.  This is quite stable without any evidence      of ongoing ischemia.  Continue current therapy.  2. Hypertension.  Blood pressure is still a bit elevated but he is      somewhat symptomatic from [place] bringing it down.  We will continue to      let him adjust.  I have asked him  to keep a blood pressure log from      home and we will see him back in one month.  3. Hyperlipidemia.  The most recent LDL was at goal at 47, HDL was 41.      Continue Statin.  4. Tobacco use.  Once again, I reminded him of the critical importance      of him quitting.   DISPOSITION:  We will see him back in 1 month to reassess his  hypertension.     Bevelyn Buckles. Bensimhon, MD  Electronically Signed    DRB/MedQ  DD: 05/15/2007  DT: 05/15/2007  Job #: [id number]

## 2011-01-15 NOTE — Assessment & Plan Note (Signed)
[facility] OFFICE NOTE   [name], [name]                       MRN:          [id number]  DATE:04/06/2007                            DOB:          Aug 29, 1935    PRIMARY CARE PHYSICIAN:  [name], M.D.   INTERVAL HISTORY:  [name] is a very pleasant 76 year old male with  a very pleasant 76 year old male with a history of coronary artery  disease, status post stenting of his left circumflex with a Sypher drug  eluting stent in December 2006, severe hypertension, renal artery  stenosis status post renal artery stent in April 2008, COPD with ongoing  tobacco use and torticollis.  He was previously followed by [name],  returns today for routine follow-up.  Since we last saw him, he is doing  well.  He denies any chest pain, shortness of breath or lower extremity  edema.  His blood pressures at home continue to run in the 130-140  range.  He has cut back but is still smoking about three packs of  cigarettes a week and finds it hard to stop.  He denies any calf  claudication but does say his thighs get fatigued when he walks up hill.   CURRENT MEDICATIONS:  1. Aspirin 325 a day.  2. Accupril 40 b.i.d.  3. Norvasc 10 a day.  4. Plavix 75 a day.  5. Hydrochlorothiazide 25 a day.  6. Simvastatin 40 a day.  7. Carvedilol 25 b.i.d.  8. Tekturna 300 a day.  9. Clonidine 0.1 b.i.d.   PHYSICAL EXAMINATION:  VITAL SIGNS:  Respirations are unlabored.  Blood  pressure is 152/88, heart rate 74, weight 141.  GENERAL APPEARANCE:  He is in no acute distress, ambulatory around the  clinic without any significant difficulty.  HEENT:  He has significant torticollis.  He also has previous right eye  enucleation with implant.  Otherwise normal.  NECK:  Once again, he has torticollis.  No JVD.  Carotids are 2+  bilaterally without bruits.  There is no lymphadenopathy or thyromegaly.  LUNGS:  Clear.  CARDIOVASCULAR:   PMI is nondisplaced.  There is regular rate and rhythm  with an S4, no murmur.  ABDOMEN:  Soft, nontender, nondistended, no hepatosplenomegaly, no  bruits, no masses and good bowel sounds.  EXTREMITIES:  Warm with no clubbing, cyanosis, or edema.  No ulceration.  NEUROLOGIC:  Alert and oriented x3.  Cranial nerves II-XII intact.  He  moves all four extremities without difficulty.  Affect is pleasant.  SKIN:  He has multiple tattoos.   EKG shows normal sinus rhythm with T-wave inversion in V5 and V6 which  is unchanged.   ASSESSMENT/PLAN:  1. Coronary artery disease which is stable without any evidence of      ongoing ischemia.  Continue current therapy.  2. Hypertension.  Blood pressure remained elevated.  Will increase      clonidine to 0.2 b.i.d.  See him back every month for continued      titration.  Hopefully will be able to control him on the clonidine  and perhaps even wean back some of his other medications.  If this      does not work, will have to try minoxidil.  3. Status post renal artery stenting.  Will check his kidney function      based on his labs drawn by [name].  4. Hyperlipidemia.  This is followed by [name].  Given his coronary      artery disease, needs an LDL under 70.  We are in the process of      getting his labs from [name] as well.  Continue Simvastatin.  5. Tobacco use.  Once again, remind him of the need to stop smoking.   DISPOSITION:  Return to clinic in one month.     Rodney Buckles. Bensimhon, MD  Electronically Signed    DRB/MedQ  DD: 04/06/2007  DT: 04/06/2007  Job #: [id number]

## 2011-01-15 NOTE — Assessment & Plan Note (Signed)
[facility] OFFICE NOTE   Rodney, [name]                       MRN:          [id number]  DATE:10/13/2008                            DOB:          19-Oct-1934    PRIMARY CARE PHYSICIAN:  Dewaine Oats, MD   INTERVAL HISTORY:  Harim is a very pleasant 76 year old male with a  history of coronary artery disease, status post Cypher drug-eluting  stent of the left circumflex in December 2006.  He also has  hypertension, renal artery stenosis, status post renal artery stent in  April 2008, COPD with ongoing tobacco use, and protocol as he returns  today for routine followup.   Unfortunately, his wife died in 09/24/2022.  He has been sad since he has  lost almost 15 pounds.  He said he just does not want to eat.  He denies  any chest pain or shortness of breath.  He has been compliant with all  his medications.  He is still smoking about 30-pack of cigarettes a week  and having a hard time quitting.   CURRENT MEDICATIONS:  1. Accupril 40 b.i.d.  2. Aspirin 325 a day.  3. Norvasc 10 a day.  4. Plavix 75 a day.  5. HCTZ 25 a day.  6. Simvastatin 40 a day.  7. Carvedilol 25 b.i.d.  8. Tekturna 300 a day.  9. Clonidine 0.2 b.i.d.   PHYSICAL EXAMINATION:  GENERAL:  He is very thin and in no acute  distress.  He ambulates in the clinic without any respiratory  difficulty.  VITAL SIGNS:  Blood pressure is 156/84, heart rate is 67, weight is 127,  which is down 12 pounds.  HEENT:  Normal except for his torticollis.  NECK:  Supple.  No JVD.  Carotids are distant 1+ bilaterally without any  obvious bruits.  No lymphadenopathy or thyromegaly.  CARDIAC:  PMI is  nondisplaced.  He is regular with an S4.  No murmur.  LUNGS:  Decreased air movement throughout, but otherwise clear.  ABDOMEN:  Soft, nontender, and nondistended.  No hepatosplenomegaly.  No  bruits.  No masses.  EXTREMITIES:  Warm with no cyanosis, clubbing,  or edema.  NEURO:  Alert and oriented x3.  Cranial nerves are intact except for his  torticollis.  He moves all 4 extremities without difficulty.  Affect is  pleasant.   ASSESSMENT AND PLAN:  1. Coronary artery disease.  This is stable.  Continue current      medications.  2. Hypertension.  Blood pressure is elevated.  Increase clonidine to      0.3 b.i.d.  3. Hyperlipidemia.  This is followed by Dr. Arlana Pouch.  His most recent LDL      was 52 which is at goal.  HDL was 44.  Continue current therapy.  4. Tobacco use.  I once again counseled him on the need to quit,      although I really said it is difficult for him at this time.   DISPOSITION:  Overall, he is doing well.  I will see him back in 6  months.     Bevelyn Buckles. Bensimhon, MD  Electronically Signed    DRB/MedQ  DD: 10/13/2008  DT: 10/13/2008  Job #: [id number]

## 2011-01-15 NOTE — H&P (Signed)
HISTORY AND PHYSICAL EXAMINATION   February 27, 2010   Re:  LONDYN, WOTTON          DOB:  [birth year]   CHIEF COMPLAINT:  Right upper lobe mass.   HISTORY OF PRESENT ILLNESS:  The patient is a 76 year old gentleman with  a long history of tobacco abuse who was found in September 2010, to have  an ill-defined opacity in his right upper lobe on chest x-ray.  He had a  66-month followup x-ray in May, which showed no real change in size, but  an increase in the density of the opacity.  This led to a PET scan,  which was done on February 07, 2010.  The CT component showed a 4.7 x 3.2 cm  irregular right upper lobe mass.  There were 3 other ground-glass  opacities, there was one in the left upper lobe, one just below the left  hilum associated with a bronchus, and one in the right upper lobe in  proximity to the primary mass, which measured 13 mm.  On the PET  portion, the primary mass was PET avid.  The ground-glass opacities were  PET negative.  The SUV of the primary lesion was 9.   The patient was referred to Dr. Arbutus Ped, who then referred the patient  to Dr. Edwyna Shell.  Dr. Edwyna Shell saw the patient on February 21, 2010.  He  recommended excisional biopsy of the primary mass with lobectomy if the  mass were positive on frozen section.  The patient felt uncomfortable  and wished to have a second opinion.  In the meantime, Dr. Edwyna Shell has  had an injury and will be out for an indefinite period of time.   The patient states that he does not really have problems with shortness  of breath.  He can walk up a flight of stairs or 2 flights of stairs  with these.  He denies any cough, hemoptysis, or wheezing.  He does not  use inhalers.  He does not have any chest pain, chest tightness, or jaw  pain which was his presenting symptom with his cardiac issues in 2006,  which resulted in a stent to his left circumflex.  He does have  torticollis and is scheduled for a Botox injection in early  September.   PAST MEDICAL HISTORY:  Significant for hypertension, renal artery  stenosis, previous stent, coronary artery disease, stent to his  circumflex, dyslipidemia, torticollis, mild claudication, and COPD.   CURRENT MEDICATIONS:  1. Zocor 40 mg daily.  2. Accupril 40 mg b.i.d.  3. Norvasc 10 mg daily.  4. Hydrochlorothiazide 25 mg daily.  5. Tekturna 300 mg daily.  6. Plavix 75 mg daily.  7. Aspirin 81 mg daily.  8. Coreg 25 mg b.i.d.  9. Clonidine 0.3 mg b.i.d.  10.Doxazosin 1 mg nightly.  11.Docusate sodium 100 mg b.i.d.   ALLERGIES:  He has an allergy to sulfa.   FAMILY HISTORY:  Noncontributory.   SOCIAL HISTORY:  He is retired.  He used to hand drywall.  He is  widowed.  His wife died earlier this year.  He says he is smoking about  2-3 packs of cigarettes a week.  His maximum smoking was a pack per day.  He has been smoking for 55 years.  He does live by himself, but has good  family support.   REVIEW OF SYSTEMS:  He has lost 35 pounds since his wife died.  Initially, he has had no appetite.  His  appetite has improved, but he  has not been able to put weight back on.  He gets pain in his legs with  walking, is going up an incline.  No claudication on level ground.  All  other systems are negative.   PHYSICAL EXAMINATION:  General:  The patient is a 76 year old gentleman  who is thin and in no acute distress.  He appears undernourished.  He  does have torticollis with his left shoulder, elevated neck, and had  deviated to the left.  Vital Signs:  His blood pressure is 143/94, pulse  82, respirations are 18, and his oxygen saturation is 98% on room air.  Neurological:  He is alert and oriented x3 with no focal deficits.  HEENT:  Poor dentition.  Neck:  Torticollis is noted.  No bruits,  adenopathy, or thyromegaly.  There is no cervical, supraclavicular,  axillary, or epitrochlear adenopathy.  Lungs:  Distant equal breath  sounds bilaterally.  Cardiac:  Regular  rate and rhythm.  Normal S1 and  S2.  Abdomen:  Soft and nontender.  Extremities:  Without clubbing,  cyanosis, or edema.  He has 2+ radial pulses bilaterally, not able to  palpate dorsalis pedis or posterior tibial pulses bilaterally.   LABORATORY DATA:  Pulmonary function testing shows an FEV1 of 2.28 which  is 72% predicted, FVC is 3.32, 76% predicted, FEF 25-75 was 1.27 which  is 54% predicted.  His diffusion capacity is limited at 54% predicted.  PET/CT as previously noted in addition to the lung findings.  He does  have an abdominal aortic aneurysm, which measures about 3.4 cm in  diameter and is heavily calcified.  There was some uptake in his colon  consistent with diverticulosis and some uptake in the prostate  consistent with some bladder outlet obstruction and benign prostatic  hypertrophy.   IMPRESSION:  The patient is a 76 year old gentleman with a right upper  lobe mass with his smoking history and the PET findings in the irregular  contours of the mass, this most likely represents a lung cancer, it  could represent another etiology, chronic infection or inflammation, but  more than likely this is a primary lung cancer.  He has no evidence of  metastatic disease by PET, MRI of the brain, or physical examination.  I  agree with Dr. Scheryl Darter assessment for excisional biopsy, is the best  option.  I would not trust a needle biopsy or transbronchial biopsy in  the setting if the results were negative.   I had discussed in detail with the patient the indications, risks,  benefits, and alternatives for the proposed procedure.  We did discuss  the need for an excisional biopsy for definitive diagnosis.  If frozen  section is positive for cancer, we will proceed with a definitive lung  cancer resection at that time.  The planned procedure will be  bronchoscopy, right video-assisted thoracoscopy, wedge resection, frozen  section determination by pathology followed by right upper  lobectomy,  and mediastinal lymph node dissection if this is in fact positive.  We  did discuss the other ground-glass opacities which were negative by PET  and the need to continue to follow those and those are indeterminate at  the present time.   I discussed in detail with him the nature of the operation, need for  general anesthesia, expected hospital stay, and overall recovery.  We  did discuss the risks and benefits.  He understands the risks include,  but not limited to death,  MI, DVT, PE, bleeding, stroke, possible need  for transfusions, infections, prolonged air leakage of bronchopleural  fistula, other organ system dysfunction including respiratory or renal  insufficiency or GI complications.  He understands and accepts these  risks and does wish to proceed with surgery.  I offered to schedule the  patient for surgery next week, the week of March 05, 2010.  However, the  patient has strong religious beliefs and does not want to do this  procedure when his minister is going to be out of the country.  Our  first date that we can mutually agreed upon is April 10, 2010.  I did  offer the patient to see one of my partners about having the operation  done sooner.  However, he had originally been scheduled for March 19, 2010, and is not concerned about the additional 3-week  waiting.  This has been almost a year since this lesion was first  discovered.  He will be admitted on the day of the procedure.   Salvatore Decent Dorris Fetch, M.D.  Electronically Signed   SCH/MEDQ  D:  02/27/2010  T:  02/27/2010  Job:  [id number]   cc:   Lajuana Matte, MD  Bevelyn Buckles. Bensimhon, MD  Dewaine Oats

## 2011-01-15 NOTE — Assessment & Plan Note (Signed)
OFFICE VISIT   DECARLO, RIVET  DOB:  03/17/1935                                        July 16, 2010  CHART #:  [id number]   HISTORY:  The patient is a 76 year old gentleman status post right upper  lobectomy and mediastinal node dissection in August of this year for a  non-small cell carcinoma of the right upper lobe.  This measured 4.5 cm  in diameter.  He elected not to have any adjuvant therapy.  He has been  doing well.  He says his only problem is that when he walks to his deer  stand in the mornings, he tends to get a little short of breath.  If he  walks there in the afternoon, he does not have any problems.  This is  not severe and is relieved within a minute or 2 of rest.  Otherwise, he  has no complaints.   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   CURRENT MEDICATIONS:  1. Zocor 40 mg daily.  2. Norvasc 10 mg daily.  3. Hydrochlorothiazide 25 mg daily.  4. Tekturna 300 mg daily.  5. Plavix 75 mg daily.  6. Coreg 25 mg b.i.d.  7. Aspirin 81 mg daily.  8. Clonidine 0.3 mg b.i.d.  9. Doxazosin 1 mg nightly.  10.Colace 100 mg b.i.d.  11.Flomax 0.4 mg daily.  12.Percocet 5/325 p.r.n.   ALLERGIES:  He has an allergy to sulfa.   REVIEW OF SYSTEMS:  See HPI.  Other than, the complaint of shortness of  breath in the mornings.  All other systems are negative.   PHYSICAL EXAMINATION:  General:  The patient is a 76 year old gentleman,  in no acute distress.  Vital Signs:  His blood pressure is 140/90, pulse  61, respirations are 18, and oxygen saturation is 92% on room air.  Lungs:  Clear with equal breath sounds bilaterally.  There is no rales  or wheezing.  His incisions are well healed.  There is no  cervical,  supraclavicular, axillary, or epitrochlear adenopathy.  Cardiac:  Regular rate and rhythm.  Normal S1 and S2.  Extremities:  There is no  peripheral edema.   DIAGNOSTIC TESTS:  Chest x-ray shows postoperative changes, otherwise  unremarkable.  There is no evidence of recurrent disease.   IMPRESSION:  The patient is a 76 year old gentleman status post right  upper lobectomy for a stage IB non-small cell cancer.  There is no  evidence of recurrent disease at this time.  I will plan to see him back  in 3 months albeit a 90-month followup visit.  We will do a CT scan at  that time.  In the meantime, regarding his morning shortness of breath,  it may just be due to cold air causing a little transient bronchospasm.  I did caution him with his history of heart disease, if that were to  show any signs of progression or worsening that he should have his heart  reevaluated.  He does have an appointment with Dr. Gala Romney in the next  week or 2 and he will mention that to him as well.   Salvatore Decent Dorris Fetch,  M.D.  Electronically Signed   SCH/MEDQ  D:  07/16/2010  T:  07/16/2010  Job:  [id number]   cc:   Bevelyn Buckles. Bensimhon, MD  Dewaine Oats  Lajuana Matte, MD

## 2011-01-15 NOTE — Assessment & Plan Note (Signed)
OFFICE VISIT   ORA, MCNATT  DOB:  Apr 28, 1935                                        May 17, 2010  CHART #:  [id number]   HISTORY OF PRESENT ILLNESS:  The patient is a 76 year old gentleman with  history of tobacco abuse.  He was found to have a ill-defined right  upper lobe opacity in September 2010, and was followed for awhile and  developed increased density and more mass-like characteristics, a PET  scan showed increased uptake.  He also had multiple low ground-glass  opacities which were all PET negative.  He was referred to Dr. Edwyna Shell  initially but Dr. Edwyna Shell injured his shoulder on Monday, but able to  take care of the patient, so did a right upper lobectomy and node  dissection on him on August 9.  Postoperatively, his course was  remarkable for a prolonged air leak and subcutaneous emphysema.  Ultimately, he was discharged home on August 22.  Since that he had been  doing well.  He has not had any trouble with his breathing.  He has not  been having any chest pain.  He does have some mild incisional soreness.  His exercise tolerance is good.  He is in good spirits.  His torticollis  has worsened and he is scheduled for another injection over in [facility] for that in the next week or two.  He has not seen Dr. Arbutus Ped  since his surgery, I think given that he had one basic  question of  chemotherapy should be addressed and I will leave that up to Dr. Arbutus Ped  and the patient's discretion as to whether that would be advisable in  his case.  From a surgical standpoint, he is doing quite well and I will  plan to see him back in 2 months with a chest x-ray and Dr. Arbutus Ped is  going to be following him as well.  We can set up alternating schedule,  so the patient is not seeing 2 physicians at a time for one problem but  I do think he needs to be followed every 3-4 months of the first 2 years  and then at least every 6 months until 5  years.   PHYSICAL EXAMINATION:  Lungs have distant but equal breath sounds and  are clear.  There is no wheezing.  His incisions are healing well.  Cardiac exam has regular rate and rhythm.  Normal S1 and S2.   Chest x-ray, the patient is somewhat rotated.  There is some volume loss  on the right side from the lobectomy, but otherwise appears normal.   IMPRESSION:  The patient is a 76 year old gentleman with a stage I B non-  small cell carcinoma, which has been resected with a right upper  lobectomy.  I would like him to see Dr. Arbutus Ped again to discuss  chemotherapy and I do not think he would probably choose to do so in  this particular patient but should be addressed given that his tumor was  4.5 cm.   Salvatore Decent Dorris Fetch, M.D.  Electronically Signed   SCH/MEDQ  D:  05/17/2010  T:  05/18/2010  Job:  [id number]   cc:   Bevelyn Buckles. Bensimhon, MD  Dewaine Oats  Lajuana Matte, MD

## 2011-01-15 NOTE — Letter (Signed)
February 21, 2010   Lajuana Matte, MD  [phone]  [place], [place] [address]   Re:  [name], [name]                DOB:  Aug 09, 1935   Dear Arbutus Ped,   I appreciate the opportunity of seeing the patient.  This is a 76-year-  old.  The patient has a long history of tobacco abuse and has had  previous coronary artery stent in his circumflex.  This is a drug-  eluting stent.  He also had renal artery stenosis with stenting without  any effect on his hypertension.  He continues to smoke half a pack or  more of cigarettes a day.  Does not drink.  He has had no hemoptysis,  fever, chills, excessive sputum.  He was found to have a right upper  lobe mass on CT scan with two satellite nodules and a question of  another lesion in his left upper lobe that was of ground-glass  appearance.  PET scan was positive in the right upper lobe lesions but  no mediastinal nodes.  There also was no uptake in the ground-glass  lesions.  He has had no weight loss.  He says he does not get short of  breath.  His other major medical problem is torticollis, which he has  been treated with Botox injections at [facility].   MEDICATIONS:  Zocor 40 mg a day, Accupril 40 mg twice a day, Norvasc 10  mg daily, hydrochlorothiazide 25 mg daily, Tekturna 300 mg daily, Plavix  75 mg daily, Coreg 25 mg daily, aspirin, Catapres, Cardura 1 mg daily,  clonidine 0.4 b.i.d.   ALLERGIES:  He is allergic to sulfa.   FAMILY HISTORY:  Noncontributory.   SOCIAL HISTORY:  He works in Development worker, community.  He is widowed.  He has two  children.  Does not drink alcohol.   REVIEW OF SYSTEMS:  He is 135 pounds.  He is 5 feet 10 inches.  GENERAL:  His weight has been stable.  CARDIAC:  See history of present illness.  No recent angina.  PULMONARY:  No hemoptysis.  GI:  No nausea, vomiting.  GU:  No kidney disease, dysuria.  VASCULAR:  He has got claudication.  No DVT or TIAs.  NEUROLOGICAL:  No dizziness, headaches, blackouts, seizures.  MUSCULOSKELETAL:  No arthritis.  PSYCHIATRIC:  No depression or nervousness.  EYE/ENT:  No change in eyesight or hearing.  HEMATOLOGICAL:  No problems with bleeding, clotting disorders, or  anemia.   PHYSICAL EXAMINATION:  His blood pressure is 187/98, pulse 78,  respirations 18, saturations were 97%.  Head, Eyes, Ears, Nose, and  Throat:  Unremarkable.  Neck:  Supple without thyromegaly.  There is no  supraclavicular or axillary adenopathy.  On examination of his neck, he  has torticollis with his head to his left, but he has good mobility of  his head.  Chest:  Clear to auscultation and percussion.  Heart:  Regular sinus rhythm.  Abdomen:  Soft.  There is no hepatosplenomegaly.  Extremities:  Pulses are 2+ and there is 1+ pedal pulses.  Neurological:  He is oriented x3.  Sensory and motor intact.  Cranial nerves intact.   IMPRESSION:  My impression is that it would be best to resect his right  upper lobe and the two satellite lesions and watch the left lung.  We  will check pulmonary function tests on him.  He will not get an  injection of  Botox for his torticollis until August, but I think we can  proceed prior to this.  I discussed the risk and benefits of the  procedure and he agrees with procedure.  I told him to hopefully stop  smoking prior to the procedure.  We will see him back again.  We will  schedule him for July 18 at [facility].   Ines Bloomer, M.D.  Electronically Signed   DPB/MEDQ  D:  02/21/2010  T:  02/22/2010  Job:  [id number]

## 2011-01-18 NOTE — Op Note (Signed)
[name], [name] NO.:  [id number]   MEDICAL RECORD NO.:  [id number]          PATIENT TYPE:  INP   LOCATION:  2807                         FACILITY:  [facility]   PHYSICIAN:  Salvadore Farber, MD  DATE OF BIRTH:  1935-03-08   DATE OF PROCEDURE:  12/24/2006  DATE OF DISCHARGE:                               OPERATIVE REPORT   2   PROCEDURE:  Selective bilateral renal angiography, stenting of the left  renal artery, Starclose closure of the right common femoral arteriotomy  site.   INDICATIONS:  [name] is a 76 year old gentleman with hypertension  that has been progressively becoming impossible to control despite  compliance with maximum doses of five medications including a diuretic.  He has also had progressive renal insufficiency with creatinine rising  from 1.3 to 1.8.  Duplex ultrasound demonstrated severe left renal  artery stenosis with a peak systolic velocity of 481 cm/sec and renal  aortic ratio of 5.8.  We, therefore, recommended proceeding to renal  angiography with possible revascularization.   PROCEDURE TECHNIQUE:  Informed consent was obtained.  Under 1% lidocaine  local anesthesia, a 5-French sheath was placed in the right common  femoral artery using modified Seldinger technique.  Over a Wholey wire,  a pigtail catheter was positioned in the suprarenal abdominal aorta.  Abdominal aortography was performed by injection of carbon dioxide.  Both renal arteries were identified.  Selective bilateral renal  angiography was then performed using a crossover catheter.  This  demonstrated normal right renal artery and at least an 80% stenosis of  the left renal artery.  We decided to proceed to percutaneous  revascularization on the left.   Anticoagulation was initiated with bivalirudin.  The ACT was confirmed  to be greater than 225 seconds.  The sheath was exchanged over a wire  for a 7-French sheath.  A 7-French LIMA guiding catheter was  advanced  over the wire and engaged in the ostium of the left renal artery.  A  stabilizer wire was advanced into the distal renal artery without  difficulty.  The lesion was directly stented using a 7 by 15 mm  Herculink stent deployed at 14 atmospheres (balloon diameter 7.5 mm).  Final angiography demonstrated no residual stenosis, no dissection, and  TIMI III flow to the distal vasculature.  The catheter was then removed  over the wire.  The arteriotomy was closed using a Starclose device.  Complete hemostasis was obtained.  He was then transferred to the  holding room in stable condition having tolerated the procedure well.   TOTAL CONTRAST:  48 mL.   COMPLICATIONS:  None.   FINDINGS:  1. Right renal artery:  Single vessel which is angiographically      normal.  2. Left renal artery:  Single vessel with at least 80% napkin ring      stenosis stented to no residual.   The patient will be maintained on his current antihypertensive regimen  with close attention paid to his blood pressure and renal function.  He  will be observed in the hospital overnight.  Descending Randa Evens,  MD procedure by catheter.      Salvadore Farber, MD  Electronically Signed     WED/MEDQ  D:  12/24/2006  T:  12/24/2006  Job:  [id number]   cc:   Deirdre Pippins, M.D.

## 2011-01-18 NOTE — Discharge Summary (Signed)
[name], [name]              ACCOUNT NO.:  [id number]   MEDICAL RECORD NO.:  [id number]          PATIENT TYPE:  INP   LOCATION:  6529                         FACILITY:  [facility]   PHYSICIAN:  Noralyn Pick. Eden Emms, MD, FACCDATE OF BIRTH:  11/26/1934   DATE OF ADMISSION:  12/24/2006  DATE OF DISCHARGE:  12/25/2006                               DISCHARGE SUMMARY   PRIMARY CARDIOLOGIST:  Theron Arista C. Eden Emms, MD, [facility]   CONSULTING CARDIOLOGIST:  Salvadore Farber, MD   PRIMARY CARE PHYSICIAN:  Dr. Deirdre Pippins   PROCEDURES PERFORMED DURING HOSPITALIZATION:  1. Selective bilateral renal angiography with stenting of the left      renal artery per Dr. on December 24, 2006.      a.     Duplex ultrasound prior to the procedure demonstrated severe       left renal artery stenosis with peak systolic velocity of 481       cm/sec and renal-cardiac ratio of 5.8.      b.     Single-vessel renal artery disease with 80% napkin-ring       stenosis stented to no residual.   PRIMARY DIAGNOSES:  1. Renal artery stenosis as described above.  2. Coronary artery disease.      a.     Status post placement of Cypher drug-eluting stent to the       circumflex for unstable angina in 2006.  3. Ongoing tobacco abuse.  4. Hypertension.  5. Hypercholesterolemia.  6. Renal insufficiency.  7. Lower extremity peripheral arterial disease.   HISTORY OF PRESENT ILLNESS:  This is a 76 year old male with a history  of hypertension becoming progressively difficult to control despite  compliance with maximum doses of the five medications including a  diuretic.  The patient also had progressive renal insufficiency with a  creatinine rising from 1.3 to 1.8.  The patient was seen and examined by  Dr. Randa Evens at a prior office visit on December 19, 2006, and  reviewed the patient's ultrasound along with recommendations for a renal  artery arteriogram with intervention.  The patient had agreed to proceed  with this and was  admitted on December 24, 2006, for procedure.   The patient's procedure was completed per Dr. Samule Ohm on December 24, 2006,  as described above, with very good results.  The patient tolerated the  procedure well.  There was no evidence of bleeding, bruit, hematoma or  infection at the right groin site.  The patient's blood pressure  remained stable on his current medication regimen, and this was to be  continued on discharge.  The patient was seen and examined by Dr. Charlton Haws on discharge along with Dr. Randa Evens prior to discharge.  He was found to be stable and will have followup appointment to include  renal ultrasound as an outpatient.   LABORATORY ON DISCHARGE:  Hemoglobin 12.5, hematocrit 36.5, white blood  cells 7.0, platelets 164.  Sodium 144, potassium 3.0, chloride 100, CO2  of 34, glucose 94, BUN 25, creatinine 1.5.   VITAL SIGNS:  Blood pressure 152/77, pulse 68,  respirations 18,  temperature 98.2, O2 sat 92% on room air.   FOLLOWUP PLANS AND APPOINTMENTS:  1. The patient is scheduled for a renal ultrasound on May 5 at 10:30      a.m.  He has been advised to be fasting for this procedure.  2. The patient is to follow up with Dr. Samule Ohm on May 5 at 2:45 p.m.      following the ultrasound for post procedure evaluation and      continuation of care.  3. The patient has been advised to continue his current medication      regimen as at home.  4. The patient has been given post procedure wound care with      particular emphasis on the right groin site for evidence of      bleeding, hematoma, or signs of infection.   DISCHARGE MEDICATIONS:  1. Aspirin 325 mg once a day.  2. Accupril 40 mg twice a day.  3. Norvasc 10 mg once a day.  4. Plavix 75 mg once a day.  5. HCTZ 25 mg daily.  6. Simvastatin 40 mg daily.  7. Carvedilol 25 mg b.i.d.  8. Tekturna 300 mg daily.  9. Nitroglycerin 0.4 mg p.r.n. chest discomfort.   ALLERGIES:  SULFA.   TIME SPENT WITH THE PATIENT  TO INCLUDE PHYSICIAN TIME:  35 minutes.      Bettey Mare. Lyman Bishop, NP      Noralyn Pick. Eden Emms, MD, [facility]  Electronically Signed    KML/MEDQ  D:  12/25/2006  T:  12/25/2006  Job:  [id number]   cc:   Dewaine Oats

## 2011-01-18 NOTE — Assessment & Plan Note (Signed)
[facility]                              CARDIOLOGY OFFICE NOTE   ARLIE, [name]                       MRN:          [id number]  DATE:04/24/2006                            DOB:          [birth year]    HISTORY OF PRESENT ILLNESS:  Mr. Gearheart is a 76 year old gentleman who  presented with unstable angina in 2006.  Cardiac catheterization  demonstrated culprit lesion to be in the circumflex.  This was treated with  a 2.75 x 18 mm Cypher drug eluting stent.  Course since then has been  uncomplicated.  Unfortunately, he continues to smoke.   He has had no chest discomfort, PND, orthopnea, edema, exertional dyspnea,  palpitations or claudications.   CURRENT MEDICATIONS:  1. Aspirin 325 mg daily.  2. Plavix 75 mg daily.  3. Accupril 40 mg b.i.d.  4. Norvasc 10 mg daily.  5. HCTZ 25 mg daily.  6. Toprol XL 50 mg daily.  7. Simvastatin 40 mg daily.   PHYSICAL EXAMINATION:  GENERAL APPEARANCE:  Well-appearing, no distress.  VITAL SIGNS:  Heart rate 74, blood pressure 136/80, weight 146 pounds.  NECK:  No jugular venous distention, no thyromegaly.  LUNGS:  Clear to auscultation, though expiratory phase is prolonged.  HEART:  He has a nondisplaced point of maximal cardiac impulse.  There is a  regular rate and rhythm without murmurs, gallops, rubs.  ABDOMEN:  Soft, nondistended, nontender.  There is no hepatosplenomegaly and  no pulsatile midline mass.  There is no bruit.  Bowel sounds are normal.  The extremities are warm without cyanosis, clubbing or edema or ulceration.  Carotid pulses 2+ bilaterally without bruits.   IMPRESSION/RECOMMENDATIONS:  1. Coronary disease, status post Cypher drug eluting stent in the      circumflex for non-ST elevation myocardial infarction.  Continue      aspirin and Plavix indefinitely.  Continue beta blocker and ACE      inhibitor as well.  2. Hypertension.  Blood pressure still a bit high.  Hopefully, this  will      improve with smoking cessation.  I would expect it to.  3. Hypercholesterolemia.  Taking Zocor.  Check lipids and LFT's today.  4. Ongoing tobacco abuse.  Patient now says he is interested in quitting.      We will prescribe Chantix.  Emphasized the critical importance of this.  5. Difficult to control hypertension with creatinine 1.6.  If blood      pressure does not improve markedly with smoking cessation, will check      renal Duplex.                                 Salvadore Farber, MD    WED/MedQ  DD:  04/24/2006  DT:  04/24/2006  Job #:  [id number]   cc:   Dewaine Oats

## 2011-01-18 NOTE — Assessment & Plan Note (Signed)
[facility]                            CARDIOLOGY OFFICE NOTE   Rodney, Lynch                       MRN:          [id number]  DATE:11/19/2006                            DOB:          1935-04-04    HISTORY OF PRESENT ILLNESS:  [name] is a 76 year old gentleman who  presented with unstable angina in 2006.  I placed a Cypher drug-eluting  stent in his circumflex.  He has had no recurrent chest discomfort.  He  has no PND, orthopnea, edema, palpitations, syncope, or presyncope.   Unfortunately, he continues to smoke.  Blood pressure has been very  poorly controlled over the past 6 months, prompting his primary care  physician to increase his medications in a step-wise fashion.  Nonetheless, blood pressure remains poorly controlled.   His current medications are:  1. Aspirin 325 mg daily.  2. Accupril 40 mg twice daily.  3. Norvasc 10 mg daily.  4. Plavix 75 mg daily.  5. Hydrochlorothiazide 25 mg daily.  6. Simvastatin 40 mg daily.  7. Carvedilol 25 mg twice daily.  8. Tekturna 300 mg daily.   PHYSICAL EXAMINATION:  He is thin, and appears older than his stated  age.  Heart rate is 73.  Blood pressure 170/100 on the left, and 178/94 on the  right.  He weighs 146 pounds.  NECK EXAM:  Is remarkable for torticollis.  He has no jugular venous  distention, thyromegaly, or lymphadenopathy.  Respiratory effort is normal.  Expiratory phase is prolonged, but lungs  are clear.  He has nondisplaced point of maximal cardiac impulse.  There is a  regular rate and rhythm without murmur, rub, or gallop.  ABDOMEN:  Soft, non-distended, and non-tender.  There is no  hepatosplenomegaly and no midline pulsatile mass.  Question soft bruit  on the right.  Bowel sounds normal.  EXTREMITIES:  Warm without clubbing, cyanosis, edema, or ulceration.  Femoral pulses 2+ bilaterally.   Electrocardiogram demonstrates normal sinus rhythm with very minor  nonspecific ST-T abnormalities apically.   IMPRESSION/RECOMMENDATIONS:  1. Coronary disease:  Doing nicely after placing a drug-eluting stent      in the circumflex for non-ST elevation myocardial infarction.      Continue aspirin and Plavix indefinitely.  Continue ACE inhibitor      and beta blocker as well.  2. Hypertension:  Blood pressure profoundly elevated despite marked      increase in his medications.  We will look for secondary cause with      urinalysis, CMET, and renal artery Duplex.  Follow up in 2 weeks.  3. Hypercholesterolemia:  Managed by Dr. Arlana Pouch.  Goal LDL less than 70.  4. Ongoing tobacco abuse:  Emphasized critical importance of complete      cessation.     Rodney Farber, MD  Electronically Signed    WED/MedQ  DD: 11/19/2006  DT: 11/19/2006  Job #: [id number]   cc:   Dewaine Oats

## 2011-01-18 NOTE — Progress Notes (Signed)
[facility]                        PERIPHERAL VASCULAR OFFICE NOTE   [name], [name]                       MRN:          [id number]  DATE:01/05/2007                            DOB:          1934/10/12    PRIMARY CARE PHYSICIAN:  Dr. Dewaine Oats   HISTORY OF PRESENT ILLNESS:  [name] is a 76 year old gentleman with  coronary disease and difficult to control hypertension.  I placed a  stent in his left renal artery on December 24, 2006.  The procedure was  uncomplicated.  Groin has been without difficulty.  Unfortunately, his  blood pressure has remained uncontrolled on multiple medications.   CURRENT MEDICATIONS:  1. Aspirin 325 mg daily.  2. Accupril 40 mg twice daily.  3. Norvasc 10 mg daily.  4. HCTZ 25 mg daily.  5. Tekturna 300 mg daily.  6. Carvedilol 25 mg twice daily.  7. Simvastatin 40 mg daily.  8. Plavix 75 mg daily.   PHYSICAL EXAMINATION:  GENERAL:  He is generally well-appearing in no  distress.  VITAL SIGNS:  Heart rate 88, blood pressure 164/90, and weight of 143  pounds.  NECK:  He has no jugular venous distention, thyromegaly, or  lymphadenopathy.  LUNGS:  Clear to auscultation  HEART:  He has a nondisplaced point of maximal cardiac impulse.  There  is a regular rate and rhythm without murmur, rub, or gallop.  ABDOMEN:  Soft, nondistended, nontender.  There is no  hepatosplenomegaly.  Bowel sounds are normal.  No abdominal bruit.  The  right femoral pulse is 2+ without bruit.   IMPRESSION/RECOMMENDATION:  1. Hypertensive renal disease status post stenting of the left renal      artery.  Blood pressure is unfortunately not improved.  We will add      clonidine patch 0.2, have him follow with his primary care doctor.      Continue all other medications.  2. Coronary disease status post CYPHER drug-eluting stent to the      circumflex for a non-ST elevation myocardial infarction.  Continue      aspirin and Plavix  indefinitely.  Continue ACE inhibitor and beta-      blocker.  3. Hypertension.  Per number one.  4. Hypercholesterolemia.  Continue Zocor.  5. Ongoing tobacco abuse.  I re-emphasized the critical importance of      complete cessation.  I      have recently provided him with a Chantix prescription.  6. Chronic renal insufficiency.  Check B-MET today.   I will have him follow up with Dr. Gala Romney in our [place] office.     Salvadore Farber, MD  Electronically Signed    WED/MedQ  DD: 01/05/2007  DT: 01/05/2007  Job #: [id number]

## 2011-01-18 NOTE — Cardiovascular Report (Signed)
[name], PROUT NO.:  [id number]   MEDICAL RECORD NO.:  [id number]          PATIENT TYPE:  INP   LOCATION:  4799                         FACILITY:  [facility]   PHYSICIAN:  Salvadore Farber, M.D. LHCDATE OF BIRTH:  [birth year]   DATE OF PROCEDURE:  08/22/2005  DATE OF DISCHARGE:                              CARDIAC CATHETERIZATION   PROCEDURE:  Left heart catheterization, left ventriculography, coronary  angiography, drug-eluting stent placed in the mid circumflex, selective  bilateral renal angiography.   INDICATIONS:  [name] is a 76 year old gentleman without prior history  of cardiovascular disease. He presents with week of chest discomfort  occurring primarily with minimal exertion. He has also had a couple of  episodes of pain at rest. He was admitted to the hospital and ruled out for  myocardial infarction. He was then referred for on diagnostic angiography  and possible percutaneous coronary intervention.   PROCEDURAL TECHNIQUE:  Informed consent was obtained. Under 1% lidocaine  local anesthesia, a 5-French sheath was placed in the right common femoral  artery using modified Seldinger technique.  Diagnostic angiography and  ventriculography were performed using JL-4, JR-4, and pigtail catheters.  These images demonstrated the culprit lesion to be a 95% stenosis of the mid  circumflex. Decision was made to proceed to percutaneous revascularization.   Anticoagulation was initiated with heparin and double bolus eptifibatide.  The patient was enrolled in the CHAMPION study. CHAMPION study drug was  administered. ACT was maintained at greater than 200 seconds.   Sheath was upsized over a wire to 6-French. A 6-French Voda left 3.5 guide  was advanced over a wire and engaged in the ostial left main. A Prowater  wire was advanced to the distal circumflex without difficulty. The lesion  was predilated using 2.5 x 9 mm Maverick at 6 atmospheres. It was  then  stented using a 2.7 x 18 mm CYPHER deployed at 18 atmospheres. With  positioning of the stent and stent deployment, the patient developed  symptomatic bradycardia. Blood pressure dropped to approximately 80 with  heart rate as low as 36.  Both responded promptly to the administration of  IV atropine. The stent was then post dilated using a 3.25 x 15 mm Quantum at  18 atmospheres. Final angiography demonstrated approximately 10% residual  stenosis. A jailed marginal had TIMI-1 flow at completion of the procedure.  The patient had no changes in his electrocardiogram and no chest discomfort.  Since this marginal had a long segment of severe disease proximally and the  patient was asymptomatic, I elected to not reopen that. Final angiography  demonstrated 10% residual stenosis, no dissection, and TIMI-3 flow to the  distal vasculature.   COMPLICATIONS:  None.   FINDINGS:  1.  LV:  184/14/8. EF 60% without regional wall motion abnormality.  2.  No aortic stenosis or mitral regurgitation.  3.  Left main:  Very short vessel which is angiographically normal.  4.  LAD:  Moderate size vessel giving rise to two diagonals. The distal LAD      has a 40% stenosis focally.  5.  Circumflex:  Very large, dominant vessel. It gives rise to three      marginals and a PDA. Spanning the second ostium of the second marginal,      there was a 95% stenosis stented to 10% residual. The second marginal      has a long segment of 90% stenosis.  6.  RCA:  Small nondominant vessel. It is angiographically normal.  7.  Renal arteries:  Single left renal artery has a 50% proximal stenosis.      The single right renal artery angiographically normal.  8.  Abdominal aorta:  Diffuse circumflex using a drug-eluting stent.  He      should be maintained on Plavix for at least a year and aspirin      indefinitely.  9.  Preserved left ventricular systolic function.  10. Moderate left renal artery stenosis.  11.  Possible infrarenal abdominal aortic aneurysm. Will obtain outpatient      ultrasound.      Salvadore Farber, M.D. [facility]  Electronically Signed     WED/MEDQ  D:  08/22/2005  T:  08/24/2005  Job:  [id number]   cc:   Dr. Ashley Mariner L. Cheree Ditto, M.D.  Fax: [phone]

## 2011-01-18 NOTE — H&P (Signed)
NAMEREYNOLDO, MAINER NO.:  [id number]   MEDICAL RECORD NO.:  [id number]          PATIENT TYPE:  EMS   LOCATION:  MAJO                         FACILITY:  [facility]   PHYSICIAN:  Deirdre Peer. Polite, M.D. DATE OF BIRTH:  [birth year]   DATE OF ADMISSION:  08/20/2005  DATE OF DISCHARGE:                                HISTORY & PHYSICAL   CHIEF COMPLAINT:  Chest pain.   HISTORY OF PRESENT ILLNESS:  A 76 year old male with a known history of  hypertension, tobacco abuse, seen in the ED for complaint of chest pain.  The patient states that over the last week he has had 3 episodes of chest  pressure/tightness and discomfort.  The patient states the first occasion  happened about a week ago when he was out in the woods after placing some  corn in a field to trap deer.  Upon walking up a grade back to the car, the  patient had heaviness in his chest and discomfort in his throat.  At that  time, it was about 2/10.  The patient had a similar event a few days later  with discomfort he rated a 4/10.  Again, the patient had discomfort while  reaching in the back of the car to put a baby seat in the car and had  similar discomfort again with chest heaviness and tightness in his throat.  The patient denied any palpitations or shortness of breath.  No diaphoresis.  The patient presented to the ED because of the recurrent nature of those  events.  In the ED, the patient was evaluated, blood pressure 160/86, pulse  68, respiratory rate 18.  The patient had an EKG without acute  abnormalities.  Point of care enzymes within normal limits.  CBC within  normal limits.  Chest x-ray showing COPD without acute abnormality.  [facility]  hospitalist was called for further evaluation and treatment for possible  unstable angina.  At the time of my evaluation, the patient was alert and  oriented x3 without pain, and without any other complaints.  Admission is  deemed necessary for further evaluation and  treatment.   PAST MEDICAL HISTORY:  As stated above, significant for -  1.  Hypertension.  2.  Tobacco use.   MEDICATIONS ON ADMISSION:  1.  Accupril which he takes b.i.d.  2.  Toprol daily.  3.  Norvasc 10 mg daily.  4.  Hydrochlorothiazide 25 mg daily.  5.  Aspirin 325 mg   SOCIAL HISTORY:  Positive for tobacco a half pack per day for 40 years.  No  alcohol, no drugs.   PAST SURGICAL HISTORY:  1.  Significant for hemorrhoidectomy in the 1970s.  2.  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   ALLERGIES:  Reports an allergy to SULFA.   FAMILY HISTORY:  Mother with cancer of the breast.  Father with abdominal  aortic aneurysm.  Sister with peripheral vascular disease and hypertension.   REVIEW OF SYSTEMS:  As stated in the HPI.  Negative for fever or chills.  No  nausea, no  vomiting, no diarrhea, no palpitations, no bloody stools, no  blood in the urine.  The patient occasionally complains of pains in his legs  after walking a prolonged period of time.  Question whether this represents  claudication.   PHYSICAL EXAMINATION:  GENERAL:  Alert and oriented x3.  VITAL SIGNS:  Temperature 98.7, blood pressure 160/86, pulse 68, respiratory  rate 18, satting 96%.  HEENT:  Left eye reactive to light.  Anicteric sclerae.  Moist oral mucosa.  There were no nodes, no JVD.  CHEST:  Clear to auscultation.  CARDIOVASCULAR:  Regular.  ABDOMEN:  Soft, nontender.  EXTREMITIES:  No edema, 2+ pulse.  NEUROLOGIC:  Essential nonfocal.   DATA:  Chest x-ray revealed no acute disease.  EKG revealed normal sinus  rhythm.  No acute ST changes.  Point of care enzymes x1 within normal  limits.   ASSESSMENT:  1.  Exertional chest pain x3.  2.  Hypertension.  3.  Tobacco use.   RECOMMENDATIONS:  Recommend the patient be admitted to a telemetry floor  bed.  The patient will have serial cardiac enzymes.  I will obtain an EKG in  the a.m.  Will check TSH, cardiac  evaluation for risk stratification.  Aspirin, beta blocker.  Consider Lovenox.  Make further recommendations  after review of the laboratory studies.      Deirdre Peer. Polite, M.D.  Electronically Signed     RDP/MEDQ  D:  08/21/2005  T:  08/21/2005  Job:  [id number]

## 2011-01-18 NOTE — Cardiovascular Report (Signed)
[name], [name] NO.:  [id number]   MEDICAL RECORD NO.:  [id number]          PATIENT TYPE:  INP   LOCATION:  4799                         FACILITY:  [facility]   PHYSICIAN:  Salvadore Farber, M.D. LHCDATE OF BIRTH:  05-18-1935   DATE OF PROCEDURE:  DATE OF DISCHARGE:                              CARDIAC CATHETERIZATION   Audio too short to transcribe (less than 5 seconds)      Salvadore Farber, M.D. [facility]     WED/MEDQ  D:  08/22/2005  T:  08/22/2005  Job:  [id number]

## 2011-01-18 NOTE — Discharge Summary (Signed)
[name], [name]              ACCOUNT NO.:  [id number]   MEDICAL RECORD NO.:  [id number]          PATIENT TYPE:  INP   LOCATION:  6526                         FACILITY:  [facility]   PHYSICIAN:  Hollice Espy, M.D.DATE OF BIRTH:  [birth year]   DATE OF ADMISSION:  08/20/2005  DATE OF DISCHARGE:  08/24/2005                                 DISCHARGE SUMMARY   CONSULTATIONS:  Rosine Abe, M.D., of [facility].   DISCHARGE DIAGNOSES:  1.  Non-ST elevated myocardial infarction, status post a Cypher stent.  2.  Hypertension.  3.  Peripheral vascular disease.  4.  Hyperglycemia.  5.  Hyperlipidemia.  6.  Renal insufficiency.   DISCHARGE MEDICATIONS:  1.  The patient will continue on aspirin daily.  2.  Accupril 40 mg p.o. b.i.d.  3.  Norvasc 10 mg p.o. daily.  4.  We will change his Toprol from 100 mg XL down to 25 mg daily.  5.  He will be also on Plavix 75 mg p.o. daily.  6.  Nitrostat 0.4 mg sublingual p.r.n.   HISTORY OF PRESENT ILLNESS:  The patient is a 76 year old white male with  past medical history of hypertension and COPD, who has had an increased  episode of chest tightness with exertion for the last several days.  It had  apparently, according to him, been occurring intermittently over the past  few months.  He came in for further evaluation.  EKG and enzymes were  negative.  The patient was evaluated by [facility] and given his  history, it was felt that he would benefit more from a catheterization.   The patient's ACE and hydrochlorothiazide were held, and he was continued on  Lovenox and aspirin and taken for a catheterization on December 21.  There  they noted a 50% left renal artery stenosis, but more concerning was a 95%  stenosis of the osteal meatus.  The patient had a Cypher stent placed.  Postoperatively he is doing well.  His cardiac enzymes were trending up and  it was felt that periprocedure he had had a small non-ST elevated MI.   The  patient was stable postop, had no complications.  He was followed and  remained stable and by December 23, he was doing well.  The plan is for him  to be discharged home on aspirin and Plavix with the medication adjustments  as above.  He will follow up with [facility] on January 12 at 9:45  a.m.   With regard to patient's renal insufficiency, he was noted to have 50% renal  artery stenosis.  His creatinine is down to 1.3.  He will continue on his  ACE.   In regard to the his peripheral vascular disease, ABIs were done secondary  to the patient complaining of occasional burning in both of his legs,  especially with exertion.  They noted a mild reduction of arterial flow on  the right and left was normal.  Both Doppler wave forms were normal.  The  patient was continued on aspirin and Plavix.   In regard to the patient's hypertension,  medication adjustments were made.   Plans will be for the patient to be discharged to home.  He will follow up  with his PCP in the next two to three weeks and follow up with [facility] as above.   OVERALL DISPOSITION:  Improved.   ACTIVITY:  As per cardiac rehab.  He was advised to quit smoking.   The patient is being discharged to home on a heart-healthy diet.      Hollice Espy, M.D.  Electronically Signed     SKK/MEDQ  D:  08/24/2005  T:  08/27/2005  Job:  [id number]   cc:   Elmore Guise., M.D.  Fax: [phone]

## 2011-01-18 NOTE — Progress Notes (Signed)
[facility]                        PERIPHERAL VASCULAR OFFICE NOTE   [name], [name]                       MRN:          [id number]  DATE:12/19/2006                            DOB:          November 07, 1934    HISTORY OF PRESENT ILLNESS:  [name] is a 76 year old gentleman with  coronary artery disease, who has difficult-to-control hypertension.  Blood pressure has not been controlled, despite compliance with  substantial doses of five medications.  Renal duplex, performed December 03, 2006, demonstrates severe left renal artery stenosis with a peak  systolic velocity of 481 cm per second and a renal aortic ratio of 5.8.  The left kidney is preserved in size, relative to the right.  His kidney  function has shown some progressive decline.  Creatinine was 1.3 in  December 2006; 1.6 in June 2007, and 1.8 in March 2008.  He has not had  any heart failure or episodes of renal failure.   PAST MEDICAL HISTORY:  1. Coronary disease, status post placement of a Cypher drug eluting      stent in his circumflex for unstable angina in 2006.  2. Ongoing tobacco abuse.  3. Hypertension.  4. Hypercholesterolemia.  5. Renal insufficiency.  6. Lower extremity peripheral arterial disease.  7. Hemorrhoidectomy in the 1970s.  8. 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   ALLERGIES:  SULFA.   CURRENT MEDICATIONS:  1. Aspirin 325 mg daily.  2. Accupril 40 mg twice daily.  3. Norvasc 10 mg daily.  4. Plavix 75 mg daily.  5. HCTZ 25 mg daily.  6. Simvastatin 40 mg daily.  7. Carvedilol 25 mg twice daily.  8. Tekturna 300 mg daily.  9. He has sublingual nitroglycerin, which he has not used.   SOCIAL HISTORY:  Accompanied by his wife today.  Smokes at least a pack  of tobacco, as he has for more than 40 years.  Denies alcohol and  illicit drug use.   FAMILY HISTORY:  Mother with breast cancer.  Father with abdominal  aortic aneurysm.  Sister with  peripheral vascular disease, requiring an  amputation.   REVIEW OF SYSTEMS:  Negative in detail, except as above.   PHYSICAL EXAMINATION:  He is generally well-appearing, in no distress,  with heart rate 74, blood pressure 166/96 and weight of 146 pounds.  He  has no jugular venous distention, thyromegaly or lymphadenopathy.  HEENT:  Normal.  Musculoskeletal exam is normal with the exception of  torticollis.  He has no jugular venous distention or lymphadenopathy.  Skin exam is remarkable for multiple tattoos and is otherwise normal.  Respiratory effort is normal.  Lungs are clear to auscultation with the  exception of a prolonged expiratory phase.  He has a nondisplaced point  of maximal cardiac impulse.  There is a regular rate and rhythm without  murmur, rub or gallop.  The abdomen is soft, nondistended, nontender.  There is no hepatosplenomegaly.  Bowel sounds are normal.  Extremities  are warm without clubbing, cyanosis, edema or ulceration.  Carotid  pulses are 2+ bilaterally without bruit.  Femoral pulses 2+  bilaterally  without bruit.  The DP and PT pulse is not palpable on either side.  He  is alert and oriented times three with normal affect and normal  neurologic exam.   IMPRESSION/RECOMMENDATION:  1. Uncontrolled hypertension with progressive renal insufficiency      despite compliance with five medications.  Duplex ultrasound      confirms severe left renal artery stenosis with preserved kidney      size on that side.  Given his progressive renal insufficiency and      uncontrolled hypertension, I have recommended proceeding to      angiography with an eye to revascularization of the left kidney.      Risks, including bleeding, infection, vascular injury, and      worsening renal function, were explained in detail to [name]      and his wife.  He is eager to proceed.  We will continue him on the      aspirin and Plavix.  We will hold the hydrochlorothiazide the day       of the procedure.  2. Coronary disease:  Status post Cypher drug eluting stent in the      circumflex for a non-ST-elevation myocardial infarction.  Continue      aspirin and Plavix indefinitely.  Continue beta blocker and ACE      inhibitor.  3. Hypertension:  Per #1.  4. Hypercholesterolemia:  Continue Zocor.  5. Ongoing tobacco abuse:  Recently started Chantix.  We emphasized      critical importance of complete cessation.  6. Chronic renal insufficiency:  We will watch carefully and minimize      dye load.     Salvadore Farber, MD  Electronically Signed    WED/MedQ  DD: 12/19/2006  DT: 12/20/2006  Job #: [id number]

## 2011-01-18 NOTE — Consult Note (Signed)
NAMESHAYE, LAGACE NO.:  [id number]   MEDICAL RECORD NO.:  [id number]          PATIENT TYPE:  INP   LOCATION:  4710                         FACILITY:  [facility]   PHYSICIAN:  Charlton Haws, M.D.     DATE OF BIRTH:  01-13-1935   DATE OF CONSULTATION:  DATE OF DISCHARGE:                                   CONSULTATION   CONSULTING PHYSICIAN:  Charlton Haws, M.D.   Consult for chest pain.   Mr. Staron is a 76 year old patient admitted by the medical service for  chest pain.  He does not frequent the hospital.  He has a primary care  doctor in [place].  He is a 40+ pack year smoker.  He continues to smoke and  chew tobacco.  He is retired.  He has been having exertional chest pain for  seven days.  The pain is somewhat worrisome for angina.  It radiates to his  arms and his jaw.  It does get relief when he sits down.  He has been having  multiple episodes over the last seven days and was admitted to the hospital  earlier today.   The patient's other risk factors include a family history of coronary  disease and hypertension.   He was on a diuretic and an ACE inhibitor prior to admission for his  hypertension and his creatinine was 1.7.   REVIEW OF SYSTEMS:  Otherwise remarkable for some coughing and chronic  bronchitis.  There is also pain in the back of the calves when he walks  which may represent claudication.  He has had previous traumatic right eye  injury as a youngster using a pocket knife and has a prosthetic right eye.  Vision in the left eye is fine.  He has had hemorrhoid surgery in the past.   MEDICATIONS:  Listed in the chart, they include:  Accupril, Toprol, Norvasc,  hydrochlorothiazide, and an aspirin.  Since he has been in the hospital, he  was started on Lovenox.   ALLERGIES:  SULFUR.   PHYSICAL EXAMINATION:  VITAL SIGNS:  The blood pressure is 140/80, pulse is  70 and regular.  LUNGS:  Clear and somewhat hyperinflated with no wheezes.   Carotids are  normal.  SKIN:  He has multiple tattoos all over his arms.  HEART:  There is an S1 S2 with normal heart sounds.  ABDOMEN:  Benign.  He has good femoral pulses with no bruits.  EXTREMITIES:  He has palpable popliteal pulses.  He has a palpable PT on the  right but no palpable PT or DP on the left.   EKG is normal.  Initial set of enzymes are negative.  As indicated, he is a  little bit dry with a BUN of 28 and a creatinine of 1.7.  Other labs are  unremarkable.   IMPRESSION:  I had a long discussion with the patient and his wife.  His  symptoms are worrisome for angina.  The wife has had angioplasty and  stenting by our group and has seen Dr. Samule Ohm and Dr. Riley Kill in the past.  They are  both in agreement with doing the cath.  The risks including stroke,  dye allergy, kidney problems, bleeding, and need for emergency surgery were  discussed.  They were willing to proceed.  I suspect if we hydrate the  patient today and tomorrow, his creatinine will drop to the 1.4 1.5 level.   We will hold his Accupril and hydrochlorothiazide.  He will get normal  saline at 100 cc/hr all day today and in the morning.   I do not think he needs bicarb or Mucomyst.   The patient needs to stop smoking, particularly if he has multivessel  disease.  He will need further workup including PFTs and an FEV1 pre and  post bronchodilator.  Following that smoking cessation with possible aid of  Wellbutrin or Chantix may be in order.   We will get some idea of his peripheral circulation during his heart cath.  I did not hear any femoral bruits.  He may below knee disease which can be  assessed with ABIs.   His Lovenox will be held in the morning prior to his cath.           ______________________________  Charlton Haws, M.D.     PN/MEDQ  D:  08/21/2005  T:  08/21/2005  Job:  [id number]

## 2011-01-29 ENCOUNTER — Ambulatory Visit
Admission: RE | Admit: 2011-01-29 | Discharge: 2011-01-29 | Disposition: A | Discharge status: Home / Self Care | Payer: Medicare Other | Source: Ambulatory Visit | Attending: Thoracic Surgery (Cardiothoracic Vascular Surgery) | Admitting: Thoracic Surgery (Cardiothoracic Vascular Surgery)

## 2011-01-29 ENCOUNTER — Ambulatory Visit (INDEPENDENT_AMBULATORY_CARE_PROVIDER_SITE_OTHER): Payer: Medicare Other | Admitting: Thoracic Surgery (Cardiothoracic Vascular Surgery)

## 2011-01-29 DIAGNOSIS — J984 Other disorders of lung: Secondary | ICD-10-CM

## 2011-01-29 DIAGNOSIS — C3490 Malignant neoplasm of unspecified part of unspecified bronchus or lung: Secondary | ICD-10-CM

## 2011-01-30 NOTE — Assessment & Plan Note (Signed)
OFFICE VISIT  [name], [name] DOB:  1935-04-20                                        Jan 29, 2011 CHART #:  [id number]  The patient is a 76 year old gentleman who presents for a 38-month followup after resection for lung cancer.  HISTORY OF PRESENT ILLNESS:  The patient had a right upper lobectomy for a 4.5 cm stage I B non-small cell carcinoma in August 2011.  He was last seen in the office in February for 37-month followup at which time, he was doing well.  He states that he continues to do well and still bothered by his torticollis which is unchanged.  She had an injection about 3 weeks ago.  His weight has been stable.  He has not had any problems with his breathing.  He continues to smoke and he says less than half a pack of cigarettes a day.  He has not had any chest pain or shortness of breath.  He does note that he tends to bleed easily and had questions about coming off Plavix.  He had a stent placed 4 years ago.  PAST MEDICAL HISTORY:  Significant for stage I B non-small cell carcinoma, status post right upper lobectomy, coronary artery disease previous PTCA and stenting.  COPD, tobacco abuse, hypertension, renal artery stenosis, dyslipidemia, claudication.  MEDICATIONS:  Unchanged.  He is taking: 1. Zocor 40 mg daily. 2. Norvasc 10 mg daily. 3. Hydrochlorothiazide 25 mg daily. 4. Tekturna 300 mg daily. 5. Plavix 75 mg daily. 6. Coreg 25 mg b.i.d. 7. Aspirin 81 mg daily. 8. Clonidine 0.3 mg b.i.d. 9. Doxazosin 1 mg p.o. nightly. 10.Colace 100 mg b.i.d.  ALLERGIES:  He has an allergy to sulfa.  FAMILY AND SOCIAL HISTORY:  Unchanged.  As noted he does continue to smoke cigarettes.  REVIEW OF SYSTEMS:  See HPI, otherwise negative.  PHYSICAL EXAMINATION:  General:  The patient is a 76 year old gentleman, in no acute distress.  Vital signs:  Blood pressure is 134/82, pulse 78, respirations are 20, oxygen saturation is 99% on room air.   Lungs: Generally, he is a very thin 76 year old gentleman in no acute distress. Neurologically:  He is alert and oriented x3 with no focal motor deficits other than his torticollis which is quite dramatic.  Lungs have diminished breath sounds bilaterally.  There is no cervical, supraclavicular, axillary, or epitrochlear adenopathy.  Cardiac:  He has regular rate and rhythm.  Normal S1 and S2.  No murmurs, rubs, or gallops.  LABORATORY DATA:  CT of the chest is reviewed and compared to the study from February of this year.  It shows no evidence of recurrent disease. There are stable ill-defined ground-glass opacities and small nodules in the left lung.  There are advanced atherosclerotic changes of the aorta. The surgical changes on the right lung.  IMPRESSION:  The patient is a 76 year old gentleman who is now 9 months out from right upper lobectomy for stage I B non-small cell carcinoma. He refused adjuvant therapy.  He is now 9 months out with no evidence of recurrent disease.  Once again, spent a good deal of time counseling regarding the importance of smoking cessation, but he is not interested in quitting at this time.  He did have questions regarding discontinuing his Plavix.  He had a previous stent 4 years ago.  I advised  him to check with Dr. Gala Romney regarding the advisability of that but to continue taking the Plavix for now.  I will plan on seeing him back in 3 months for 1-year followup visit.  We will do a CT of the chest that time.  [name], M.D. Electronically Signed  SCH/MEDQ  D:  01/29/2011  T:  01/30/2011  Job:  [id number]  cc:   Bevelyn Buckles. Bensimhon, MD [facility] Crocker

## 2011-02-18 ENCOUNTER — Encounter: Payer: Self-pay | Admitting: Internal Medicine

## 2011-03-20 ENCOUNTER — Other Ambulatory Visit: Payer: Self-pay | Admitting: Thoracic Surgery (Cardiothoracic Vascular Surgery)

## 2011-03-20 DIAGNOSIS — C3490 Malignant neoplasm of unspecified part of unspecified bronchus or lung: Secondary | ICD-10-CM

## 2011-03-25 ENCOUNTER — Other Ambulatory Visit: Payer: Self-pay | Admitting: Cardiology

## 2011-03-25 DIAGNOSIS — I714 Abdominal aortic aneurysm, without rupture: Secondary | ICD-10-CM

## 2011-03-26 ENCOUNTER — Encounter (INDEPENDENT_AMBULATORY_CARE_PROVIDER_SITE_OTHER): Payer: Medicare Other | Admitting: *Deleted

## 2011-03-26 DIAGNOSIS — I714 Abdominal aortic aneurysm, without rupture: Secondary | ICD-10-CM

## 2011-03-26 DIAGNOSIS — I700 Atherosclerosis of aorta: Secondary | ICD-10-CM

## 2011-03-27 ENCOUNTER — Encounter: Payer: Self-pay | Admitting: Internal Medicine

## 2011-04-03 ENCOUNTER — Telehealth: Payer: Self-pay

## 2011-04-03 MED ORDER — DOXAZOSIN MESYLATE 1 MG PO TABS: 1.0000 mg | ORAL_TABLET | Freq: Every day | ORAL | Status: DC

## 2011-04-03 NOTE — Telephone Encounter (Signed)
Needs a refill for doxazosin mesylate sent to [facility] pharmacy.  Notified patient need to make an appointment with either Dr. Gala Romney or Dr. Mariah Milling since it has been one year. The patient will call back for an appointment.

## 2011-04-22 ENCOUNTER — Encounter: Payer: Medicare Other | Admitting: Thoracic Surgery (Cardiothoracic Vascular Surgery)

## 2011-04-22 ENCOUNTER — Inpatient Hospital Stay: Admission: RE | Admit: 2011-04-22 | Payer: Medicare Other | Source: Ambulatory Visit

## 2011-05-28 ENCOUNTER — Encounter: Payer: Self-pay | Admitting: Thoracic Surgery

## 2011-05-30 ENCOUNTER — Telehealth: Payer: Self-pay

## 2011-05-30 MED ORDER — CLONIDINE HCL 0.3 MG PO TABS: 0.3000 mg | ORAL_TABLET | Freq: Two times a day (BID) | ORAL | Status: DC

## 2011-05-30 NOTE — Telephone Encounter (Signed)
Duplicate message for clonidine

## 2011-05-30 NOTE — Telephone Encounter (Signed)
Refill sent for clonidine 0.3 mg.

## 2011-06-04 ENCOUNTER — Encounter: Payer: Medicare Other | Admitting: Thoracic Surgery (Cardiothoracic Vascular Surgery)

## 2011-06-04 ENCOUNTER — Other Ambulatory Visit: Payer: Medicare Other

## 2011-06-11 ENCOUNTER — Encounter: Payer: Medicare Other | Admitting: Thoracic Surgery (Cardiothoracic Vascular Surgery)

## 2011-06-17 ENCOUNTER — Other Ambulatory Visit: Payer: Medicare Other

## 2011-06-17 DIAGNOSIS — C801 Malignant (primary) neoplasm, unspecified: Secondary | ICD-10-CM | POA: Insufficient documentation

## 2011-06-18 ENCOUNTER — Ambulatory Visit
Admission: RE | Admit: 2011-06-18 | Discharge: 2011-06-18 | Disposition: A | Discharge status: Home / Self Care | Payer: Medicare Other | Source: Ambulatory Visit | Attending: Thoracic Surgery (Cardiothoracic Vascular Surgery) | Admitting: Thoracic Surgery (Cardiothoracic Vascular Surgery)

## 2011-06-18 ENCOUNTER — Ambulatory Visit (INDEPENDENT_AMBULATORY_CARE_PROVIDER_SITE_OTHER): Payer: Medicare Other | Admitting: Thoracic Surgery (Cardiothoracic Vascular Surgery)

## 2011-06-18 ENCOUNTER — Encounter: Payer: Self-pay | Admitting: Thoracic Surgery (Cardiothoracic Vascular Surgery)

## 2011-06-18 DIAGNOSIS — R911 Solitary pulmonary nodule: Secondary | ICD-10-CM

## 2011-06-18 DIAGNOSIS — J984 Other disorders of lung: Secondary | ICD-10-CM

## 2011-06-18 DIAGNOSIS — C3490 Malignant neoplasm of unspecified part of unspecified bronchus or lung: Secondary | ICD-10-CM

## 2011-06-18 DIAGNOSIS — Z9889 Other specified postprocedural states: Secondary | ICD-10-CM

## 2011-06-18 DIAGNOSIS — Z902 Acquired absence of lung [part of]: Secondary | ICD-10-CM

## 2011-06-18 DIAGNOSIS — C3410 Malignant neoplasm of upper lobe, unspecified bronchus or lung: Secondary | ICD-10-CM

## 2011-06-18 NOTE — Progress Notes (Signed)
PCP is Jaclyn Shaggy, MD Referring Provider is Jaclyn Shaggy, MD  Chief Complaint  Patient presents with  . Follow-up    3 month f/u with Chest Ct    HPI: [name] is a 76 year old gentleman who returns for one year followup visit after a right upper lobectomy for a stage IB non-small cell carcinoma. He was last in the office in May at which time he was doing well. Smoking cessation was advised at that visit, but he continues to smoke about half a pack of cigarettes daily. He's not had any shortness of breath. He denies any new headaches or visual changes, he does have a hearing aid since I last saw him which he says makes a big difference. He denies productive cough or hemoptysis, he does have a chronic dry cough- unchanged. Her weight has been stable.  Past Medical History  Diagnosis Date  . CAD (coronary artery disease)     -s/p Cypher drug eluting stent to LCX for unstable angina in 2006  . Renal artery stenosis   . COPD (chronic obstructive pulmonary disease)     w ongoing tobacco abuse  . HTN (hypertension)   . Hypercholesterolemia   . Renal insufficiency     baseline Cr 1.5-1.8  . PAD (peripheral artery disease)   . 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 Torticollis   . Opacity, cornea, central     RUL opacity on CXR 9/10 - refused CT  . Adenocarcinoma     Right upper lobe     Past Surgical History  Procedure Date  . Coronary stent placement   . Bronchoscopy 04/10/2010    Lashan Gluth  . Rt vats, rt upper lobectomy, mediastinal lymph node dissection 04/10/2010    Carmichael Burdette    Family History  Problem Relation Age of Onset  . Breast cancer Mother   . Peripheral vascular disease Sister     requiring on amputation    Social History History  Substance Use Topics  . Smoking status: Current Everyday Smoker  . Smokeless tobacco: Not on file   Comment: Smoked 4-5 cigs per day, as he has for more than 40 years.   . Alcohol Use: No     Current Outpatient Prescriptions  Medication Sig Dispense Refill  . aliskiren (TEKTURNA) 300 MG tablet Take 300 mg by mouth daily.        Marland Kitchen amLODipine (NORVASC) 10 MG tablet Take 10 mg by mouth daily.        Marland Kitchen aspirin (ASPIR-81) 81 MG EC tablet Take 81 mg by mouth daily.        . carvedilol (COREG) 25 MG tablet Take 25 mg by mouth 2 (two) times daily.        . cloNIDine (CATAPRES) 0.3 MG tablet Take 1 tablet (0.3 mg total) by mouth 2 (two) times daily.  60 tablet  6  . clopidogrel (PLAVIX) 75 MG tablet Take 75 mg by mouth daily.        Marland Kitchen docusate sodium (COLACE) 100 MG capsule Take 100 mg by mouth 2 (two) times daily.        Marland Kitchen doxazosin (CARDURA) 1 MG tablet Take 1 tablet (1 mg total) by mouth at bedtime.  15 tablet  2  . hydrochlorothiazide 25 MG tablet Take 25 mg by mouth daily.        . quinapril (ACCUPRIL) 40 MG tablet Take 40 mg by mouth 2 (two) times  daily.        . simvastatin (ZOCOR) 40 MG tablet Take 40 mg by mouth at bedtime.        . Tamsulosin HCl (FLOMAX) 0.4 MG CAPS Take 0.4 mg by mouth daily.          Allergies  Allergen Reactions  . Sulfonamide Derivatives     Review of Systems: See HPI  BP 146/78  Pulse 82  Resp 18  Ht 5\' 10"  (1.778 m)  Wt 127 lb (57.607 kg)  BMI 18.22 kg/m2  SpO2 99% Physical Exam: Thin 76 year old white male in no acute distress Torticollis unchanged from previously No cervical, supraclavicular, axillary or epitrochlear adenopathy Lungs diminished breath sounds bilaterally no wheezing Wound will heal  Diagnostic Tests: CT of the chest reviewed postoperative changes noted. COPD. 5.1 mm left upper lobe nodule adjacent to some scarring, increased in size relative to last scan. Foci of groundglass opacities unchanged  Impression: [name] is a 76 year old gentleman who is now one year out from resection of a stage IB non-small cell carcinoma of the right upper lobectomy and mediastinal node dissection. He is doing well overall at this  point in time.  There is a question of a 5 mm nodule in the left upper lobe, which the Radiologist notes could be a metastatic lesion. This lesion has grown slightly in size since his last CT scan, but remains very small. In my opinion it would be more likely to represent a second primary than a metastasis, although that is possible as well. There is a high likelihood that this nodule will turn out not to be malignant. In any event, at the present time that lesion is very small, very peripheral and difficult to access. I don't think that it would be reasonable target for ENB were TTNA. We will plan to follow the lesion with a CT scan in 3 months  Plan: CT of chest in 3 months, followup left upper lobe nodule We'll see the patient back in the office after the scan is done

## 2011-07-01 ENCOUNTER — Telehealth: Payer: Self-pay

## 2011-07-01 MED ORDER — DOXAZOSIN MESYLATE 1 MG PO TABS: 1.0000 mg | ORAL_TABLET | Freq: Every day | ORAL | Status: DC

## 2011-07-01 NOTE — Telephone Encounter (Signed)
Refill sent for cardura 2 mg take 1/2 tablet daily at bedtime.  The patient made an appointment with Dr. Mariah Milling for November 2012.

## 2011-08-02 ENCOUNTER — Ambulatory Visit: Payer: Medicare Other | Admitting: Cardiovascular Disease

## 2011-08-12 ENCOUNTER — Other Ambulatory Visit: Payer: Self-pay | Admitting: Thoracic Surgery (Cardiothoracic Vascular Surgery)

## 2011-08-12 DIAGNOSIS — D381 Neoplasm of uncertain behavior of trachea, bronchus and lung: Secondary | ICD-10-CM

## 2011-08-20 ENCOUNTER — Other Ambulatory Visit: Payer: Self-pay

## 2011-08-20 ENCOUNTER — Emergency Department (HOSPITAL_COMMUNITY)
Admission: EM | Admit: 2011-08-20 | Discharge: 2011-08-20 | Disposition: A | Discharge status: Home / Self Care | Payer: Medicare Other | Attending: Emergency Medicine | Admitting: Emergency Medicine

## 2011-08-20 ENCOUNTER — Encounter (HOSPITAL_COMMUNITY): Payer: Self-pay | Admitting: Neurology

## 2011-08-20 ENCOUNTER — Emergency Department (HOSPITAL_COMMUNITY): Payer: Medicare Other

## 2011-08-20 DIAGNOSIS — D496 Neoplasm of unspecified behavior of brain: Secondary | ICD-10-CM | POA: Insufficient documentation

## 2011-08-20 DIAGNOSIS — R531 Weakness: Secondary | ICD-10-CM

## 2011-08-20 DIAGNOSIS — I739 Peripheral vascular disease, unspecified: Secondary | ICD-10-CM | POA: Insufficient documentation

## 2011-08-20 DIAGNOSIS — R259 Unspecified abnormal involuntary movements: Secondary | ICD-10-CM | POA: Insufficient documentation

## 2011-08-20 DIAGNOSIS — Z7982 Long term (current) use of aspirin: Secondary | ICD-10-CM | POA: Insufficient documentation

## 2011-08-20 DIAGNOSIS — E7800 Pure hypercholesterolemia, unspecified: Secondary | ICD-10-CM | POA: Insufficient documentation

## 2011-08-20 DIAGNOSIS — R079 Chest pain, unspecified: Secondary | ICD-10-CM | POA: Insufficient documentation

## 2011-08-20 DIAGNOSIS — R29898 Other symptoms and signs involving the musculoskeletal system: Secondary | ICD-10-CM | POA: Insufficient documentation

## 2011-08-20 DIAGNOSIS — R209 Unspecified disturbances of skin sensation: Secondary | ICD-10-CM | POA: Insufficient documentation

## 2011-08-20 DIAGNOSIS — Z79899 Other long term (current) drug therapy: Secondary | ICD-10-CM | POA: Insufficient documentation

## 2011-08-20 DIAGNOSIS — I2510 Atherosclerotic heart disease of native coronary artery without angina pectoris: Secondary | ICD-10-CM | POA: Insufficient documentation

## 2011-08-20 DIAGNOSIS — I10 Essential (primary) hypertension: Secondary | ICD-10-CM | POA: Insufficient documentation

## 2011-08-20 DIAGNOSIS — F17200 Nicotine dependence, unspecified, uncomplicated: Secondary | ICD-10-CM | POA: Insufficient documentation

## 2011-08-20 DIAGNOSIS — R279 Unspecified lack of coordination: Secondary | ICD-10-CM | POA: Insufficient documentation

## 2011-08-20 HISTORY — DX: Weakness: R53.1

## 2011-08-20 LAB — DIFFERENTIAL
Eosinophils Absolute: 0.4 10*3/uL (ref 0.0–0.7)
Eosinophils Relative: 5 % (ref 0–5)
Lymphocytes Relative: 15 % (ref 12–46)
Lymphs Abs: 1.1 10*3/uL (ref 0.7–4.0)
Monocytes Absolute: 0.8 10*3/uL (ref 0.1–1.0)
Monocytes Relative: 11 % (ref 3–12)

## 2011-08-20 LAB — CBC
HCT: 35.9 % — ABNORMAL LOW (ref 39.0–52.0)
Hemoglobin: 12.2 g/dL — ABNORMAL LOW (ref 13.0–17.0)
MCH: 30.7 pg (ref 26.0–34.0)
MCV: 90.2 fL (ref 78.0–100.0)
Platelets: 215 10*3/uL (ref 150–400)
RBC: 3.98 MIL/uL — ABNORMAL LOW (ref 4.22–5.81)
WBC: 7.1 10*3/uL (ref 4.0–10.5)

## 2011-08-20 LAB — POCT I-STAT TROPONIN I

## 2011-08-20 LAB — BASIC METABOLIC PANEL
BUN: 32 mg/dL — ABNORMAL HIGH (ref 6–23)
CO2: 22 mEq/L (ref 19–32)
Calcium: 11.2 mg/dL — ABNORMAL HIGH (ref 8.4–10.5)
Creatinine, Ser: 1.71 mg/dL — ABNORMAL HIGH (ref 0.50–1.35)
GFR calc non Af Amer: 38 mL/min — ABNORMAL LOW (ref >90)
Glucose, Bld: 104 mg/dL — ABNORMAL HIGH (ref 70–99)
Sodium: 140 mEq/L (ref 135–145)

## 2011-08-20 LAB — PROTIME-INR: Prothrombin Time: 13.2 seconds (ref 11.6–15.2)

## 2011-08-20 MED ORDER — DEXAMETHASONE SODIUM PHOSPHATE 10 MG/ML IJ SOLN
10.0000 mg | Freq: Once | INTRAMUSCULAR | Status: AC
  Administered 2011-08-20: 10 mg via INTRAVENOUS
  Filled 2011-08-20: qty 1

## 2011-08-20 MED ORDER — DIAZEPAM 5 MG PO TABS: 5.0000 mg | ORAL_TABLET | Freq: Two times a day (BID) | ORAL | Status: AC

## 2011-08-20 MED ORDER — DEXAMETHASONE 6 MG PO TABS: 6.0000 mg | ORAL_TABLET | Freq: Four times a day (QID) | ORAL | Status: DC

## 2011-08-20 MED ORDER — DIAZEPAM 5 MG PO TABS
5.0000 mg | ORAL_TABLET | Freq: Once | ORAL | Status: AC
  Administered 2011-08-20: 5 mg via ORAL
  Filled 2011-08-20: qty 1

## 2011-08-20 NOTE — ED Notes (Signed)
Family at bedside.gave pt. happymeal with soft drink

## 2011-08-20 NOTE — ED Notes (Signed)
Pt reports this morning woke up at 630 had "tingling spell" in left foot that spread up his leg, stomach and left arm. Pt reports x 2 of these spells. Pt reporting weakness in left leg. Pt reporting weakness "comes and goes". No deficits noted at this time. Pt alert and oriented. NAD. Pt hypertensive.

## 2011-08-20 NOTE — ED Notes (Signed)
Pt had left sided deficits earlier and now have resolved.  Pt has no drift and good strength in all extremities.  Called Ct to come and get pat and they are here to get patient.  Pt wanting to eat.

## 2011-08-20 NOTE — ED Provider Notes (Addendum)
History     CSN: [id number] Arrival date & time: 08/20/2011 10:36 AM   First MD Initiated Contact with Patient 08/20/11 1049      Chief Complaint  Patient presents with  . Weakness    (Consider location/radiation/quality/duration/timing/severity/associated sxs/prior treatment) HPI Comments: Patient presented today with worsening left-sided tremors or spasms and weakness in his arm and leg.  Patient notes over the last several weeks he's had slowly increasing left arm and left leg twitching or spasms.  They're intermittent in nature.  There's been no changes in his medications.  Patient had not had any focal weakness, numbness or speech changes or headaches until today.  Patient states that at approximately 6:30 this morning he had onset of some difficulty walking due to 2 left leg weakness and problems controlling his left arm.  His daughter notes that when he was trying to pull his leg out from underneath a bench he could not do it  without assistance.  Patient has noted some improvement already by the time of arrival.  She denies any fevers, nausea, vomiting.  No chest pain, palpitations or shortness of breath  Patient is a 76 y.o. male presenting with weakness. The history is provided by the patient and a relative. No language interpreter was used.  Weakness Primary symptoms do not include headaches, fever, nausea or vomiting.  Additional symptoms include weakness.    Past Medical History  Diagnosis Date  . CAD (coronary artery disease)     -s/p Cypher drug eluting stent to LCX for unstable angina in 2006  . Renal artery stenosis   . HTN (hypertension)   . Hypercholesterolemia   . Renal insufficiency     baseline Cr 1.5-1.8  . PAD (peripheral artery disease)   . 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 Torticollis   . Opacity, cornea, central     RUL opacity on CXR 9/10 - refused CT  . Adenocarcinoma     Right upper lobe     Past Surgical  History  Procedure Date  . Coronary stent placement   . Bronchoscopy 04/10/2010    [facility]  . Rt vats, rt upper lobectomy, mediastinal lymph node dissection 04/10/2010    [facility]    Family History  Problem Relation Age of Onset  . Breast cancer Mother   . Peripheral vascular disease Sister     requiring on amputation    History  Substance Use Topics  . Smoking status: Current Everyday Smoker  . Smokeless tobacco: Not on file   Comment: Smoked 4-5 cigs per day, as he has for more than 40 years.   . Alcohol Use: No      Review of Systems  Constitutional: Negative for fever and chills.  HENT: Negative.   Eyes: Negative.  Negative for discharge and redness.  Respiratory: Negative.  Negative for cough and shortness of breath.   Cardiovascular: Negative.  Negative for chest pain.  Gastrointestinal: Negative.  Negative for nausea, vomiting and abdominal pain.  Genitourinary: Negative.  Negative for hematuria.  Musculoskeletal: Negative.  Negative for back pain.  Skin: Negative.  Negative for color change and rash.  Neurological: Positive for tremors and weakness. Negative for syncope and headaches.  Hematological: Negative.  Negative for adenopathy.  Psychiatric/Behavioral: Negative.  Negative for confusion.  All other systems reviewed and are negative.    Allergies  Sulfonamide derivatives  Home Medications   Current Outpatient Rx  Name Route Sig  Dispense Refill  . ALISKIREN FUMARATE 300 MG PO TABS Oral Take 300 mg by mouth daily.      Marland Kitchen AMLODIPINE BESYLATE 10 MG PO TABS Oral Take 10 mg by mouth daily.      . ASPIRIN 81 MG PO TBEC Oral Take 81 mg by mouth daily.      Marland Kitchen CARVEDILOL 25 MG PO TABS Oral Take 25 mg by mouth 2 (two) times daily.      Marland Kitchen CLONIDINE HCL 0.3 MG PO TABS Oral Take 1 tablet (0.3 mg total) by mouth 2 (two) times daily. 60 tablet 6  . CLOPIDOGREL BISULFATE 75 MG PO TABS Oral Take 75 mg by mouth daily.      Marland Kitchen DOCUSATE SODIUM 100 MG PO CAPS  Oral Take 100 mg by mouth 2 (two) times daily.      Marland Kitchen DOXAZOSIN MESYLATE 1 MG PO TABS Oral Take 1 tablet (1 mg total) by mouth at bedtime. 15 tablet 2  . HYDROCHLOROTHIAZIDE 25 MG PO TABS Oral Take 25 mg by mouth daily.      Marland Kitchen SIMVASTATIN 40 MG PO TABS Oral Take 40 mg by mouth at bedtime.      . TAMSULOSIN HCL 0.4 MG PO CAPS Oral Take 0.4 mg by mouth daily.      Marland Kitchen DEXAMETHASONE 6 MG PO TABS Oral Take 1 tablet (6 mg total) by mouth every 6 (six) hours. 20 tablet 0  . DIAZEPAM 5 MG PO TABS Oral Take 1 tablet (5 mg total) by mouth 2 (two) times daily. 20 tablet 0    BP 152/96  Pulse 78  Temp(Src) 97.1 F (36.2 C) (Oral)  Resp 21  SpO2 98%  Physical Exam  Constitutional: He is oriented to person, place, and time. He appears well-developed and well-nourished.  Non-toxic appearance. He does not have a sickly appearance.  HENT:  Head: Normocephalic and atraumatic.  Eyes: Conjunctivae, EOM and lids are normal. Pupils are equal, round, and reactive to light.  Neck: Trachea normal, normal range of motion and full passive range of motion without pain. Neck supple.  Cardiovascular: Normal rate, regular rhythm and normal heart sounds.   Pulmonary/Chest: Effort normal and breath sounds normal. No respiratory distress. He has no wheezes. He has no rales.  Abdominal: Soft. Normal appearance. He exhibits no distension. There is no tenderness. There is no rebound and no CVA tenderness.  Musculoskeletal: Normal range of motion.  Neurological: He is alert and oriented to person, place, and time. He has normal strength. He displays normal reflexes. No cranial nerve deficit. He exhibits normal muscle tone. Coordination abnormal.       Patient's face is symmetric, speech is clear and tongue is midline.  Extraocular eye movements are intact.  Pupils are equal round reactive to light.  Cranial nerves II through XII are intact.  Patient has some left arm pronator drift.  Symmetric grip strength.  Sensation is  symmetrical in his arms and his legs.  He has some decreased strength in lifting the left leg off the bed against resistance.  He has difficulty with his balance but is able to walk.  Normal finger to nose bilaterally.  Normal heel to shin bilaterally.  Skin: Skin is warm, dry and intact. No rash noted.  Psychiatric: He has a normal mood and affect. His behavior is normal. Judgment and thought content normal.    ED Course  Procedures (including critical care time)  Labs Reviewed  CBC - Abnormal; Notable for the following:  RBC 3.98 (*)    Hemoglobin 12.2 (*)    HCT 35.9 (*)    All other components within normal limits  BASIC METABOLIC PANEL - Abnormal; Notable for the following:    Glucose, Bld 104 (*)    BUN 32 (*)    Creatinine, Ser 1.71 (*)    Calcium 11.2 (*)    GFR calc non Af Amer 38 (*)    GFR calc Af Amer 44 (*)    All other components within normal limits  DIFFERENTIAL  APTT  PROTIME-INR  POCT I-STAT TROPONIN I  I-STAT TROPONIN I   Dg Chest 2 View  08/20/2011  *RADIOLOGY REPORT*  Clinical Data: Chest pain.  CHEST - 2 VIEW  Comparison: Chest CT 06/18/2011  Findings: COPD changes in the lungs.  Chronic changes with scarring in the right lung base.  No confluent or acute opacities.  Heart is normal size.  No effusions or bony abnormality.  IMPRESSION: COPD/chronic changes.  No active disease.  Original Report Authenticated By: Cyndie Chime, M.D.   Ct Head Wo Contrast  08/20/2011  *RADIOLOGY REPORT*  Clinical Data: Weakness, history neck turned to the left, tingling left foot; past history of hypertension, coronary artery disease, renal insufficiency, lung cancer  CT HEAD WITHOUT CONTRAST  Technique:  Contiguous axial images were obtained from the base of the skull through the vertex without contrast.  Comparison: None Correlation:  MRI brain 02/04/2010  Findings: Motion artifacts on initial images, persisting despite repeat imaging. Normal ventricular morphology. Large  area of vasogenic edema identified within the right centrum semiovale extending into subcortical white matter in the high right parietal lobe. This appears to be secondary to a right parietal mass at vertex measuring 3.1 x 2.5 cm in size, highly suspicious for a metastatic lesion due to history of lung cancer. 2 mm of right-to-left midline shift. No definite acute infarction or intracranial hemorrhage. Tiny old lacunar infarct left basal ganglia.  Slightly asymmetrically greater density is seen in the right occipital lobe versus left though this may be related to slight oblique positioning of the patient. Within limitations of the above artifacts, no definite additional mass lesions identified. Right optic globe prosthesis. No definite extra-axial collections. Partial opacification of the maxillary sinuses bilaterally. Extensive atherosclerotic calcifications of internal carotid arteries at skull base. Opacified bilateral mastoid air cells. No calvarial metastases.  IMPRESSION: Right vertex intraparenchymal parietal mass measuring 3.1 x 2.5 cm in size with extensive surrounding vasogenic edema and minimal right-to-left midline shift, highly worrisome for a CNS metastasis in a patient with history of lung cancer. Recommend MR of the brain with and without contrast for further evaluation. Tiny old left basal ganglia lacunar infarct.  Findings called to Dr. Golda Acre on 08/20/2011 at 1320 hours.  Original Report Authenticated By: Lollie Marrow, M.D.     1. Neoplasm of brain causing mass effect on adjacent structures     Date: 08/20/2011  Rate: 81  Rhythm: normal sinus rhythm  QRS Axis: normal  Intervals: normal  ST/T Wave abnormalities: normal  Conduction Disutrbances:none  Narrative Interpretation: One PVC is present  Old EKG Reviewed: unchanged from 04/04/2000     MDM  Patient had symptoms concerning for possible stroke.  He was found to have a new right-sided brain mass concerning for metastases  given patient's prior history of lung cancer and current lung nodules that were being monitored by his primary care physician.  The location of the mass likely is the cause of the  patient's left-sided spasms and weakness with problems with coordination.  I highly encourage the patient to allow me to admit him to the hospital for further evaluation of this mass given the edema around it and mild mass effect.  Patient understands that if he has increase in swelling that he could have significant brain damage and/or death.  The daughter was present during all of the discussions about this patient's care today and the findings on the head CT.  She also could not persuade the patient to stay for admission for further evaluation of his new brain mass and treatment for his new brain mass.  And the patient wanting to leave AGAINST MEDICAL ADVICE I did contact his prior oncologist Dr. Shirline Frees regarding establishing followup for patient's likely primary lung cancer given lung nodules that were seen on the CT a couple months ago.  I was able to give him the update and he will see the patient in followup.  I also contacted Dr. Wynetta Emery  From neurosurgery regarding followup for this patient and further recommendations since the patient does desire to go home.He agreed with the 10 mg of Decadron  given here in the emergency department and recommended 6 mg every 6 hours at home of Decadron and the patient was given a prescription for this with 20 tablets.  Patient was also discharged with Valium for his muscle spasms.  Per both radiology and neurosurgery recommendations patient has had an MR of his brain with and without contrast ordered.  I did contact [facility] in an attempt to schedule this for the patient.  They stated that they could not officially schedule the patient for his MRI until he was discharged from the system but initially told me they can get him in at 38 tomorrow.  When I called them back after the patient  was gone and discharged they stated that the appointment was already taken but that they have a different appointment 1 hour earlier.  They have attempted to contact the patient left a message on his machine regarding the change in appointment time.  I counseled the patient and his daughter thoroughly about the importance of followup with both the neurosurgeon and oncologist.  I also counseled him regarding the importance of use of steroids and obtaining his MRI tomorrow.  The patient did have the [facility] imaging phone number as well as the office information for both Dr. Shirline Frees and Dr. Wynetta Emery as well.  He stated that he could see the patient at 3:30 PM on Thursday and the patient was given that information as well.         Nat Christen, MD 08/20/11 1721   CRITICAL CARE Performed by: Emeline General A   Total critical care time: 49 minutes  Critical care time was exclusive of separately billable procedures and treating other patients.  Critical care was necessary to treat or prevent imminent or life-threatening deterioration.  Critical care was time spent personally by me on the following activities: development of treatment plan with patient and/or surrogate as well as nursing, discussions with consultants, evaluation of patient's response to treatment, examination of patient, obtaining history from patient or surrogate, ordering and performing treatments and interventions, ordering and review of laboratory studies, ordering and review of radiographic studies, pulse oximetry and re-evaluation of patient's condition.   Nat Christen, MD 08/20/11 [id number]

## 2011-08-20 NOTE — ED Notes (Signed)
Pt currently on monitor.

## 2011-08-21 ENCOUNTER — Encounter: Payer: Self-pay | Admitting: *Deleted

## 2011-08-21 ENCOUNTER — Ambulatory Visit
Admission: RE | Admit: 2011-08-21 | Discharge: 2011-08-21 | Disposition: A | Discharge status: Home / Self Care | Payer: Medicare Other | Source: Ambulatory Visit | Attending: Emergency Medicine | Admitting: Emergency Medicine

## 2011-08-21 DIAGNOSIS — C3490 Malignant neoplasm of unspecified part of unspecified bronchus or lung: Secondary | ICD-10-CM

## 2011-08-21 MED ORDER — GADOBENATE DIMEGLUMINE 529 MG/ML IV SOLN
6.0000 mL | Freq: Once | INTRAVENOUS | Status: AC | PRN
  Administered 2011-08-21: 6 mL via INTRAVENOUS

## 2011-08-21 NOTE — Progress Notes (Signed)
Received call from pt that he was in the ED and the MD told him needs to see Dr Donnald Garre.  Per Dr. Donnald Garre, he spoke with the ED MD and they were instructed to contact the radiation dept and the neurologist to schedule those f/u appts 1st and then we will see pt in about 3 weeks.  Onc tx schedule filled out for lab and MD in 3 weeks.  Pt verbalized understanding,  SLJ

## 2011-08-23 ENCOUNTER — Telehealth: Payer: Self-pay | Admitting: Internal Medicine

## 2011-08-23 NOTE — Telephone Encounter (Signed)
Misty Stanley called on behalf of Dr ? [facility] Neurosurgery . He requests that pt see Dr Arbutus Ped before next Thursday. I left her a message to cal me back regarding urgency of appointment

## 2011-08-28 ENCOUNTER — Other Ambulatory Visit: Payer: Self-pay | Admitting: Internal Medicine

## 2011-08-28 ENCOUNTER — Encounter: Payer: Self-pay | Admitting: Internal Medicine

## 2011-08-28 NOTE — Telephone Encounter (Signed)
Received written note today requesting appt in 3 weeks with Dr Donnald Garre

## 2011-08-28 NOTE — Progress Notes (Unsigned)
received written note requesting appt with Dr Donnald Garre in 3 weeks. I notified Dr Donnald Garre and sent a scheduling request for same.

## 2011-08-29 ENCOUNTER — Telehealth: Payer: Self-pay | Admitting: Internal Medicine

## 2011-08-29 DIAGNOSIS — C7931 Secondary malignant neoplasm of brain: Secondary | ICD-10-CM

## 2011-08-29 MED ORDER — DEXAMETHASONE 6 MG PO TABS: 6.0000 mg | ORAL_TABLET | Freq: Four times a day (QID) | ORAL | Status: AC

## 2011-08-29 NOTE — Telephone Encounter (Signed)
Per 08/28/11 oeder call pt for  3week f/u.  Called pt to give him appt for 09/19/11 at 10:00/10:30.  Pt states he was in the hosp and someone gave him an appt for Friday 08/30/11.  Advised pt that there was not any appts made and pt stated he would show [place] the appt info.  Pt grumbled something and hung up on me.  I will mail the appt info to the pt. Thurston Hole

## 2011-08-29 NOTE — Telephone Encounter (Signed)
I returned pt's call. He went to ED 08/1711 for " spasms"  and has " cancer in his head".  He is on decadron 6mg  . He requests appointment with Dr Donnald Garre. Per Dr Donnald Garre make appointment after CT scan. I spoke to his daughter in law and gave her his radiation appointments and told her I will make appt with Dr Donnald Garre after the Ct scan. She requests refill on decadron -He was also given Valium for muscle spasms. Both rx were started by provider in ED.   I called pharmacy and was told he only had 6 day supply from 12/26. Per Dr Donnald Garre I called in decadron refill.

## 2011-09-02 ENCOUNTER — Telehealth: Payer: Self-pay | Admitting: Internal Medicine

## 2011-09-02 ENCOUNTER — Encounter: Payer: Self-pay | Admitting: *Deleted

## 2011-09-02 NOTE — Telephone Encounter (Signed)
Called pt, left message, for upcoming appt on 09/05/11 lab and MD. Informed pt that appt for 1/17th has been cancelled

## 2011-09-02 NOTE — Progress Notes (Signed)
Per Dr Donnald Garre, pt can have lab and f/u this Thursday 09/05/11, called and gave rose in scheduling the appt date and times, she will contact pt.    Received refill for valium for pt, per Dr Donnald Garre, will assess whether to refill at f/u 09/05/11.  SLJ

## 2011-09-02 NOTE — Progress Notes (Unsigned)
Single.  Wife Deceased in 2009 secondary to lung cancer.    2 children   Worked as Hospital doctor for 36 years

## 2011-09-05 ENCOUNTER — Other Ambulatory Visit (HOSPITAL_BASED_OUTPATIENT_CLINIC_OR_DEPARTMENT_OTHER): Payer: Medicare Other | Admitting: Lab

## 2011-09-05 ENCOUNTER — Telehealth: Payer: Self-pay | Admitting: Internal Medicine

## 2011-09-05 ENCOUNTER — Ambulatory Visit (HOSPITAL_BASED_OUTPATIENT_CLINIC_OR_DEPARTMENT_OTHER): Payer: Medicare Other | Admitting: Internal Medicine

## 2011-09-05 DIAGNOSIS — C3490 Malignant neoplasm of unspecified part of unspecified bronchus or lung: Secondary | ICD-10-CM

## 2011-09-05 DIAGNOSIS — C7931 Secondary malignant neoplasm of brain: Secondary | ICD-10-CM

## 2011-09-05 LAB — COMPREHENSIVE METABOLIC PANEL
ALT: 16 U/L (ref 0–53)
CO2: 26 mEq/L (ref 19–32)
Calcium: 9.2 mg/dL (ref 8.4–10.5)
Chloride: 108 mEq/L (ref 96–112)
Glucose, Bld: 158 mg/dL — ABNORMAL HIGH (ref 70–99)
Sodium: 142 mEq/L (ref 135–145)
Total Protein: 5.2 g/dL — ABNORMAL LOW (ref 6.0–8.3)

## 2011-09-05 LAB — CBC WITH DIFFERENTIAL/PLATELET
BASO%: 0 % (ref 0.0–2.0)
Eosinophils Absolute: 0 10*3/uL (ref 0.0–0.5)
HCT: 32.1 % — ABNORMAL LOW (ref 38.4–49.9)
MCHC: 33.3 g/dL (ref 32.0–36.0)
MONO#: 0.3 10*3/uL (ref 0.1–0.9)
NEUT#: 15.3 10*3/uL — ABNORMAL HIGH (ref 1.5–6.5)
NEUT%: 96.9 % — ABNORMAL HIGH (ref 39.0–75.0)
Platelets: 199 10*3/uL (ref 140–400)
RBC: 3.44 10*6/uL — ABNORMAL LOW (ref 4.20–5.82)
WBC: 15.8 10*3/uL — ABNORMAL HIGH (ref 4.0–10.3)
lymph#: 0.2 10*3/uL — ABNORMAL LOW (ref 0.9–3.3)

## 2011-09-05 NOTE — Telephone Encounter (Signed)
Made appt for pet for 1/11 as no other times that were good for the daughter were avail.  Dr Donnald Garre has not given other orders yet but put in for mkm visit on 09/15/10.  Will call pt if something changes   aom

## 2011-09-06 ENCOUNTER — Encounter: Payer: Self-pay | Admitting: Radiation Oncology

## 2011-09-06 ENCOUNTER — Ambulatory Visit
Admission: RE | Admit: 2011-09-06 | Discharge: 2011-09-06 | Disposition: A | Discharge status: Home / Self Care | Payer: Medicare Other | Source: Ambulatory Visit | Attending: Radiation Oncology | Admitting: Radiation Oncology

## 2011-09-06 ENCOUNTER — Encounter: Payer: Self-pay | Admitting: *Deleted

## 2011-09-06 VITALS — BP 168/69 | HR 74 | Temp 97.9°F | Resp 18 | Ht 70.0 in | Wt 137.0 lb

## 2011-09-06 DIAGNOSIS — C7931 Secondary malignant neoplasm of brain: Secondary | ICD-10-CM | POA: Insufficient documentation

## 2011-09-06 DIAGNOSIS — I10 Essential (primary) hypertension: Secondary | ICD-10-CM | POA: Insufficient documentation

## 2011-09-06 DIAGNOSIS — C3490 Malignant neoplasm of unspecified part of unspecified bronchus or lung: Secondary | ICD-10-CM | POA: Insufficient documentation

## 2011-09-06 DIAGNOSIS — I2510 Atherosclerotic heart disease of native coronary artery without angina pectoris: Secondary | ICD-10-CM | POA: Insufficient documentation

## 2011-09-06 DIAGNOSIS — Z510 Encounter for antineoplastic radiation therapy: Secondary | ICD-10-CM | POA: Insufficient documentation

## 2011-09-06 DIAGNOSIS — Z79899 Other long term (current) drug therapy: Secondary | ICD-10-CM | POA: Insufficient documentation

## 2011-09-06 NOTE — Progress Notes (Signed)
Please see the Nurse Progress Note in the MD Initial Consult Encounter for this patient. 

## 2011-09-06 NOTE — Progress Notes (Signed)
STARTED TAKING DECADRON TODAY AT 1300, DECADRON 4MG  PO Q6 HRS.  NO PAIN.  UNCONTROLLABLE MOVEMENT DUE TO BRAIN TUMOR.

## 2011-09-06 NOTE — Progress Notes (Deleted)
Received call from Dr Colletta Maryland RN stating that pt's PET scan was rescheduled from 1/11 to 1/8.  She also stated that pt has CT scan schedule for 09/09/11 and wants to see if Dr Donnald Garre would still like for pt to get CT scan.  It appears that Dr. Dorris Fetch ordered CT scan, will give note to Dr Donnald Garre to advise whether he should keep this appt or not.  SLJ

## 2011-09-06 NOTE — Progress Notes (Signed)
[facility] Radiation Oncology NEW PATIENT EVALUATION  Name: [name] MRN: [id number]  Date: 09/06/2011  DOB: 1935/01/20  Status: outpatient   CC: Jaclyn Shaggy, MD  Mariam Dollar, MD    REFERRING PHYSICIAN: Mariam Dollar, MD   DIAGNOSIS: Metastatic lung cancer to the brain     HISTORY OF PRESENT ILLNESS:  [name] is a 77 y.o. male who was found to have a lung lesion incidentally by chest x-ray in 2010. CT scan was performed months later due to refusal of immediate workup. He was found to have a 5 cm right upper lobe mass. He ultimately underwent resection of the lung tumor on 04/10/2010. His son have pathologic T2 AN0 adenocarcinoma of the right upper lung. The margins were negative. All lymph nodes were negative. An MRI of the brain had been conducted on 02/04/2010 for staging; there was motion degradation but there were no obvious metastases appreciated on that scan.   The patient reports that in April or May of 2012 he develops new spasms and in the left bottom foot; this eventually got worse and more frequent and the spasms radiated up to his groin and also involves his left fingers in the left side of his abdomen. Eventually he developed weakness in his left leg. He presented to the emergency room in mid December. A 3 cm lesion was found in the right posterior frontal lobe. There is significant surrounding edema and mild midline shift. The center of the tumor is necrotic. It is consistent with a single brain metastasis. He has seen Dr. Wynetta Emery. My understanding from talking to the patient is Dr. Wynetta Emery does not want to perform surgery until he sees the results from the PET scan. The patient has a low burden of disease systemically he may be a candidate for resection of this brain metastasis. The patient has met with Dr. Shirline Frees it seems like he is fairly reluctant to pursue any systemic chemotherapy but he has not made a final decision on that.  Should be noted that  he underwent a CT scan of his chest without contrast on 06/18/2011 which showed a left upper lobe nodule had increased from about 3 mm to 5 mm. There are no other obvious sites of disease in his chest. He was scheduled for a PET scan on 1/11 to restage his cancer but I was able to get this moved up to 09/10/2011. He started Decadron today and is taking 6 mg every 6 hours, per the medical record. I am not sure if this is actually a 4 mg tablet but according to the medical record it is a 6 mg tablet.    PAST MEDICAL HISTORY:  has a past medical history of CAD (coronary artery disease); Renal artery stenosis; HTN (hypertension); Hypercholesterolemia; Renal insufficiency; PAD (peripheral artery disease); History of enucleation of right eyeball; Torticollis; Opacity, cornea, central; Adenocarcinoma; Coordination impairment; Weakness (08/20/11); and Brain cancer.  torticollis of the neck, chronic, due to a pinched nerve   PAST SURGICAL HISTORY:  Past Surgical History  Procedure Date  . Coronary stent placement   . Bronchoscopy 04/10/2010    [facility]  . Rt vats, rt upper lobectomy, mediastinal lymph node dissection 04/10/2010    [facility]  . Left kidney stent placement      FAMILY HISTORY: family history includes Aneurysm in his father; Breast cancer in his mother; and Peripheral vascular disease in his sister.   SOCIAL HISTORY:  reports that he has been smoking  Cigarettes.  He has a 28 pack-year smoking history. He does not have any smokeless tobacco history on file. He reports that he does not drink alcohol or use illicit drugs.   ALLERGIES: Sulfonamide derivatives   MEDICATIONS:  Current Outpatient Prescriptions  Medication Sig Dispense Refill  . aliskiren (TEKTURNA) 300 MG tablet Take 300 mg by mouth daily.       Marland Kitchen amLODipine (NORVASC) 10 MG tablet Take 10 mg by mouth daily.        Marland Kitchen aspirin (ASPIR-81) 81 MG EC tablet Take 81 mg by mouth daily.        . carvedilol (COREG) 25 MG  tablet Take 25 mg by mouth 2 (two) times daily.        . cloNIDine (CATAPRES) 0.3 MG tablet Take 1 tablet (0.3 mg total) by mouth 2 (two) times daily.  60 tablet  6  . clopidogrel (PLAVIX) 75 MG tablet Take 75 mg by mouth daily.        Marland Kitchen dexamethasone (DECADRON) 6 MG tablet Take 1 tablet (6 mg total) by mouth every 6 (six) hours.  20 tablet  0  . docusate sodium (COLACE) 100 MG capsule Take 100 mg by mouth 2 (two) times daily.        Marland Kitchen doxazosin (CARDURA) 1 MG tablet Take 1 tablet (1 mg total) by mouth at bedtime.  15 tablet  2  . hydrochlorothiazide 25 MG tablet Take 25 mg by mouth daily.        . simvastatin (ZOCOR) 40 MG tablet Take 40 mg by mouth at bedtime.        . Tamsulosin HCl (FLOMAX) 0.4 MG CAPS Take 0.4 mg by mouth daily.           REVIEW OF SYSTEMS:  Review of systems is negative for urinary incontinence, bowel incontinence numbness, headaches, and nausea. He is able to ambulate independently but this is difficult due to left leg weakness. He denies any weakness in his arms. The torticollis in his neck is chronic and he reports it is due to a pinched nerve. 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  He has decreased hearing and has a left hearing aid.     PHYSICAL EXAM:  height is 5\' 10"  (1.778 m) and weight is 137 lb (62.143 kg). His oral temperature is 97.9 F (36.6 C). His blood pressure is 168/69 and his pulse is 74. His respiration is 18.   General: Alert and oriented, in no acute distress HEENT: Head is normocephalic. Pupils are equally round and reactive to light. Extraocular movements are intact. Oropharynx is clear. No thrush  Neck: Neck is notable for torticollis - it is bent towards his left side Heart: Regular in rate and rhythm  Chest: Clear to auscultation bilaterally, with no obvious rhonchi, wheezes, or rales. Abdomen:  nontender. It is slightly distended, with no rigidity or guarding. Extremities: No cyanosis or edema. Lymphatics: No concerning  lymphadenopathy. Skin: No concerning lesions. Musculoskeletal: He has decreased strength in his left leg and this affects his gait  Neurologic: Cranial nerves II through XII are grossly intact, except he has decreased hearing and he has no right-sided vision to take due to prosthetic eye .  Speech is fluent. Coordination is limited in terms of finger to nose testing with the left hand. He has no numbness in his extremities. He does have decreased strength in his left leg Psychiatric: Judgment and insight are intact.  LABORATORY DATA:  Lab Results  Component Value Date   WBC 15.8* 09/05/2011   HGB 10.7* 09/05/2011   HCT 32.1* 09/05/2011   MCV 93.2 09/05/2011   PLT 199 09/05/2011   Lab Results  Component Value Date   NA 142 09/05/2011   K 4.7 09/05/2011   CL 108 09/05/2011   CO2 26 09/05/2011   Lab Results  Component Value Date   ALT 16 09/05/2011   AST 12 09/05/2011   ALKPHOS 37* 09/05/2011   BILITOT 0.3 09/05/2011   Imaging: as above  Pathology: as above   IMPRESSION/PLAN: This is a very pleasant 77 year old gentleman with a single brain metastasis; I would estimate his KPS to be 70.  Ideally, it would be nice if he could undergo resection of this metastasis as I believe this would probably give him the best chance of palliation. I will look forward to neurosurgery's assessment after the PET scan has restaged his cancer. I told the patient that if he does undergo resection, that adjuvant radiation would be important to decrease his chance of a local recurrence and possibly regional recurrence. I explained that stereotactic radiosurgery could be given to the resection cavity alone with good local control but regional control in the brain could be suboptimal and there would be a considerable chance that he would need whole brain radiotherapy later on. I also told him that an alternative would be to receive adjuvant whole brain radiation for 2-3 weeks after surgery which could not only provide improved  local control but also regional control in the brain. Patient is much more enthusiastic about whole brain radiotherapy. I did acknowledge that the whole brain radiotherapy would carry more potential side effects including fatigue skin irritation hair loss and possibly long-term slowing of cognition and decreased short-term memory. Nevertheless, he would prefer to receive whole brain radiation up front.  We also spoke about the scenario in which he was not a candidate for surgery. I explained that radiotherapy could could be given by stereotactic radiosurgery to the tumor alone, once again carrying more risk of regional recurrence in the brain but less risk of side effects than whole brain radiotherapy. Or, he could receive whole brain radiotherapy. In the scenario that he would not be a surgical candidate, he would still prefer whole brain radiotherapy. I told him that if he pursues whole brain radiotherapy and has the intact tumor, that stereotactic radiosurgery could be considered as a possible boost for improved local control.  It was a pleasure meeting the patient today. We discussed the risks, benefits, and side effects of radiotherapy in detail. No guarantees of treatment were given. A consent form was signed for whole brain radiotherapy and placed in the patient's medical record. The patient is enthusiastic about proceeding with treatment. I look forward to participating in the patient's care. I will look out for the results of the PET scan and touch base with Dr. Wynetta Emery to verify if he will be undergoing surgical resection prior to whole brain radiotherapy.

## 2011-09-07 NOTE — Progress Notes (Signed)
[facility] OFFICE PROGRESS NOTE  Jaclyn Shaggy, MD [address]   [place] [address]  DIAGNOSIS: Metastatic non-small cell lung cancer, adenocarcinoma, initially diagnosed as stage IB in July of 2011, now with metastatic brain lesion.  PRIOR THERAPY: Status post right upper lobectomy with lymph node dissection under the care of Dr. Dorris Fetch on 04/10/2010. The patient refused adjuvant chemotherapy.   CURRENT THERAPY: None.  INTERVAL HISTORY: [name] 77 y.o. male returns to the clinic today for followup visit accompanied by his daughter. The patient was not seen in the clinic since his surgical resection. He was followed by Dr. Dorris Fetch as he declined adjuvant chemotherapy after his surgical resection. He presented to the emergency Department at [facility] on 08/20/2011 complaining of tingling and weakness in his left lower extremity. CT of the head showed Right vertex intraparenchymal parietal mass measuring 3.1 x 2.5 cm in size with extensive surrounding vasogenic edema and minimal right-to-left midline shift, highly worrisome for a CNS metastasis in a patient with history of lung cancer. This was followed by MRI of the brain on 08/21/2011 which showed 26 x 30 x 27 mm right posterior frontal mass near the vertex with vasogenic edema, petechial hemorrhage, and central necrosis; this is consistent with an apparent solitary metastasis from lung  cancer. Motion degraded images however could obscure other foci of  small intracranial metastatic disease. 3 mm right-to-left shift. Bilateral mastoid fluid of uncertain significance. Bilateral acute maxillary sinus disease. The patient was started on Decadron 4 mg by mouth every 6 hours. There was also seen by Dr. Wynetta Emery for a neurosurgical consultation. He is considering the patient had surgical resection of the solitary brain lesion if he has no evidence of systemic disease progression. The patient is here  today for evaluation and discussion of his treatment options. He continues to have significant torticollis. Otherwise no significant complaints except for insomnia from the steroids. He is scheduled to see Dr. Basilio Cairo tomorrow for radiotherapy evaluation.     MEDICAL HISTORY: Past Medical History  Diagnosis Date  . CAD (coronary artery disease)     -s/p Cypher drug eluting stent to LCX for unstable angina in 2006  . Renal artery stenosis   . HTN (hypertension)   . Hypercholesterolemia   . Renal insufficiency     baseline Cr 1.5-1.8  . PAD (peripheral artery disease)   . 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 Torticollis   . Opacity, cornea, central     RUL opacity on CXR 9/10 - refused CT  . Adenocarcinoma     Right upper lobe   . Coordination impairment     Right Parietal Mass  . Weakness 08/20/11    Brain Mets - Left Leg and Arm Left pronator drift  . Brain cancer      Mets - Right Parietal mass - Vasogenic Edema    ALLERGIES:  is allergic to sulfonamide derivatives.  MEDICATIONS:  Current Outpatient Prescriptions  Medication Sig Dispense Refill  . aliskiren (TEKTURNA) 300 MG tablet Take 300 mg by mouth daily.       Marland Kitchen amLODipine (NORVASC) 10 MG tablet Take 10 mg by mouth daily.        Marland Kitchen aspirin (ASPIR-81) 81 MG EC tablet Take 81 mg by mouth daily.        . carvedilol (COREG) 25 MG tablet Take 25 mg by mouth 2 (two) times daily.        Marland Kitchen  cloNIDine (CATAPRES) 0.3 MG tablet Take 1 tablet (0.3 mg total) by mouth 2 (two) times daily.  60 tablet  6  . clopidogrel (PLAVIX) 75 MG tablet Take 75 mg by mouth daily.        Marland Kitchen dexamethasone (DECADRON) 6 MG tablet Take 1 tablet (6 mg total) by mouth every 6 (six) hours.  20 tablet  0  . docusate sodium (COLACE) 100 MG capsule Take 100 mg by mouth 2 (two) times daily.        Marland Kitchen doxazosin (CARDURA) 1 MG tablet Take 1 tablet (1 mg total) by mouth at bedtime.  15 tablet  2  . hydrochlorothiazide 25 MG tablet  Take 25 mg by mouth daily.        . simvastatin (ZOCOR) 40 MG tablet Take 40 mg by mouth at bedtime.        . Tamsulosin HCl (FLOMAX) 0.4 MG CAPS Take 0.4 mg by mouth daily.          SURGICAL HISTORY:  Past Surgical History  Procedure Date  . Coronary stent placement   . Bronchoscopy 04/10/2010    [facility]  . Rt vats, rt upper lobectomy, mediastinal lymph node dissection 04/10/2010    [facility]  . Left kidney stent placement     REVIEW OF SYSTEMS:  A comprehensive review of systems was negative except for: Constitutional: positive for fatigue Musculoskeletal: positive for muscle weakness Neurological: positive for torticollis   PHYSICAL EXAMINATION: General appearance: alert, cooperative and no distress Head: Normocephalic, without obvious abnormality, atraumatic Neck: no adenopathy Lymph nodes: Cervical, supraclavicular, and axillary nodes normal. Resp: clear to auscultation bilaterally Cardio: regular rate and rhythm, S1, S2 normal, no murmur, click, rub or gallop GI: soft, non-tender; bowel sounds normal; no masses,  no organomegaly Extremities: extremities normal, atraumatic, no cyanosis or edema Neurologic: Alert and oriented X 3, normal strength and tone. Normal symmetric reflexes. Normal coordination and gait  ECOG PERFORMANCE STATUS: 1 - Symptomatic but completely ambulatory  There were no vitals taken for this visit.  LABORATORY DATA: Lab Results  Component Value Date   WBC 15.8* 09/05/2011   HGB 10.7* 09/05/2011   HCT 32.1* 09/05/2011   MCV 93.2 09/05/2011   PLT 199 09/05/2011      Chemistry      Component Value Date/Time   NA 142 09/05/2011 1118   K 4.7 09/05/2011 1118   CL 108 09/05/2011 1118   CO2 26 09/05/2011 1118   BUN 55* 09/05/2011 1118   CREATININE 1.79* 09/05/2011 1118      Component Value Date/Time   CALCIUM 9.2 09/05/2011 1118   ALKPHOS 37* 09/05/2011 1118   AST 12 09/05/2011 1118   ALT 16 09/05/2011 1118   BILITOT 0.3 09/05/2011 1118       RADIOGRAPHIC  STUDIES: Dg Chest 2 View  08/20/2011  *RADIOLOGY REPORT*  Clinical Data: Chest pain.  CHEST - 2 VIEW  Comparison: Chest CT 06/18/2011  Findings: COPD changes in the lungs.  Chronic changes with scarring in the right lung base.  No confluent or acute opacities.  Heart is normal size.  No effusions or bony abnormality.  IMPRESSION: COPD/chronic changes.  No active disease.  Original Report Authenticated By: Cyndie Chime, M.D.   Ct Head Wo Contrast  08/20/2011  *RADIOLOGY REPORT*  Clinical Data: Weakness, history neck turned to the left, tingling left foot; past history of hypertension, coronary artery disease, renal insufficiency, lung cancer  CT HEAD WITHOUT CONTRAST  Technique:  Contiguous axial  images were obtained from the base of the skull through the vertex without contrast.  Comparison: None Correlation:  MRI brain 02/04/2010  Findings: Motion artifacts on initial images, persisting despite repeat imaging. Normal ventricular morphology. Large area of vasogenic edema identified within the right centrum semiovale extending into subcortical white matter in the high right parietal lobe. This appears to be secondary to a right parietal mass at vertex measuring 3.1 x 2.5 cm in size, highly suspicious for a metastatic lesion due to history of lung cancer. 2 mm of right-to-left midline shift. No definite acute infarction or intracranial hemorrhage. Tiny old lacunar infarct left basal ganglia.  Slightly asymmetrically greater density is seen in the right occipital lobe versus left though this may be related to slight oblique positioning of the patient. Within limitations of the above artifacts, no definite additional mass lesions identified. Right optic globe prosthesis. No definite extra-axial collections. Partial opacification of the maxillary sinuses bilaterally. Extensive atherosclerotic calcifications of internal carotid arteries at skull base. Opacified bilateral mastoid air cells. No calvarial  metastases.  IMPRESSION: Right vertex intraparenchymal parietal mass measuring 3.1 x 2.5 cm in size with extensive surrounding vasogenic edema and minimal right-to-left midline shift, highly worrisome for a CNS metastasis in a patient with history of lung cancer. Recommend MR of the brain with and without contrast for further evaluation. Tiny old left basal ganglia lacunar infarct.  Findings called to Dr. Golda Acre on 08/20/2011 at 1320 hours.  Original Report Authenticated By: Lollie Marrow, M.D.   [name] Wo Contrast  08/21/2011  *RADIOLOGY REPORT*  Clinical Data: New brain mass.  History of lung cancer. Worsening left-sided tremors and weakness in the arm and leg.  MRI HEAD WITHOUT AND WITH CONTRAST  Technique:  Multiplanar, multiecho pulse sequences of the brain and surrounding structures were obtained according to standard protocol without and with intravenous contrast  Contrast: 6mL MULTIHANCE GADOBENATE DIMEGLUMINE 529 MG/ML IV SOLN Half dose was used due to low GFR of 39.  Comparison: CT head 08/20/2011.  Findings: The patient was moderately uncooperative and could not remain motionless for the study.  Images are suboptimal.  Small or subtle lesions could be overlooked.  No acute stroke, hydrocephalus, or extra-axial fluid.  As suspected from CT, there is a right posterior frontal intra- axial metastasis displaying central necrosis, acute hemorrhage, and post contrast enhancement. I believe the lesion's epicenter is the precentral cortex but it is possible that the lesion  straddles the central sulcus or maybe even involves the anterior parietal lobe post central cortex. Cross-sectional measurements are 26 x 30 x 27 mm.  Within limits of detection by this study which is significantly motion degraded, there are no definite other lesions. Although the lesion is peripherally located, I do not believe it is a meningioma.  There is significant vasogenic edema.  There is approximately 3 mm of right-to-left  shift. The acute hemorrhage is peripherally located and likely petechial in nature; there is no dominant central clot.  Mild atrophy is present.  There is moderate white matter signal abnormality likely representing post treatment effect or chronic microvascular ischemic change from hypertension.  The carotid and basilar arteries are patent based on flow void signal.  There is no definite calvarial or skull base metastasis.  Air-fluid levels in the maxillary sinuses suggest acuity. Significant fluid accumulation in both middle ears and mastoids is noted without visible nasopharyngeal mass.  No definite abnormal contrast enhancement within the temporal bone.  Mastoid effusions versus mastoiditis cannot  clearly be distinguished.  Correlate clinically. Right optic globe prosthesis.  IMPRESSION: 26 x 30 x 27 mm right posterior frontal mass near the vertex with vasogenic edema, petechial hemorrhage, and central necrosis; this is consistent with an apparent solitary metastasis from lung cancer.  Motion degraded images however could obscure other foci of small intracranial metastatic disease.  3 mm right-to-left shift. Bilateral mastoid fluid of uncertain significance. Bilateral acute maxillary sinus disease.  The patient has appointments to see Dr. Arbutus Ped and Dr. Wynetta Emery within the next 24 hours.  Original Report Authenticated By: Elsie Stain, M.D.    ASSESSMENT: This is a very pleasant 77 years old white male with metastatic non-small cell lung cancer presenting with solitary brain lesion. I have a lengthy discussion with the patient and his daughter today about his condition.  PLAN:   #1 I will order a PET scan to rule out any other metastatic disease systemically . #2 I recommended for the patient will continue on Decadron 4 mg by mouth every 6 hours . #3 I think the patient would be a good candidate for surgical resection followed by radiation versus the stereotactic radiotherapy.the patient is scheduled to  see Dr. Basilio Cairo tomorrow. #4 I started the patient on Ativan 0.5 mg each bedtime when necessary insomnia or anxiety. #5 the patient come back for followup visit in one to 2 weeks for reevaluation and discussion of his scan results.    All questions were answered. The patient knows to call the clinic with any problems, questions or concerns. We can certainly see the patient much sooner if necessary.

## 2011-09-09 ENCOUNTER — Other Ambulatory Visit: Payer: Medicare Other

## 2011-09-09 ENCOUNTER — Ambulatory Visit: Payer: Medicare Other | Admitting: Thoracic Surgery (Cardiothoracic Vascular Surgery)

## 2011-09-09 NOTE — Progress Notes (Signed)
Encounter addended by: Delynn Flavin, RN on: 09/09/2011  5:55 PM<BR>     Documentation filed: Charges VN

## 2011-09-10 ENCOUNTER — Encounter (HOSPITAL_COMMUNITY): Payer: Self-pay

## 2011-09-10 ENCOUNTER — Other Ambulatory Visit: Payer: Self-pay | Admitting: Internal Medicine

## 2011-09-10 ENCOUNTER — Encounter (HOSPITAL_COMMUNITY)
Admission: RE | Admit: 2011-09-10 | Discharge: 2011-09-10 | Disposition: A | Discharge status: Home / Self Care | Payer: Medicare Other | Source: Ambulatory Visit | Attending: Internal Medicine | Admitting: Internal Medicine

## 2011-09-10 DIAGNOSIS — I714 Abdominal aortic aneurysm, without rupture, unspecified: Secondary | ICD-10-CM | POA: Insufficient documentation

## 2011-09-10 DIAGNOSIS — N281 Cyst of kidney, acquired: Secondary | ICD-10-CM | POA: Insufficient documentation

## 2011-09-10 DIAGNOSIS — C3490 Malignant neoplasm of unspecified part of unspecified bronchus or lung: Secondary | ICD-10-CM | POA: Insufficient documentation

## 2011-09-10 LAB — GLUCOSE, CAPILLARY: Glucose-Capillary: 145 mg/dL — ABNORMAL HIGH (ref 70–99)

## 2011-09-10 MED ORDER — FLUDEOXYGLUCOSE F - 18 (FDG) INJECTION
14.5000 | Freq: Once | INTRAVENOUS | Status: AC | PRN
  Administered 2011-09-10: 14.5 via INTRAVENOUS

## 2011-09-12 ENCOUNTER — Other Ambulatory Visit (HOSPITAL_COMMUNITY): Payer: Medicare Other

## 2011-09-13 ENCOUNTER — Other Ambulatory Visit: Payer: Medicare Other | Admitting: Lab

## 2011-09-13 ENCOUNTER — Other Ambulatory Visit (HOSPITAL_COMMUNITY): Payer: Medicare Other

## 2011-09-16 ENCOUNTER — Telehealth: Payer: Self-pay | Admitting: Internal Medicine

## 2011-09-16 ENCOUNTER — Ambulatory Visit (HOSPITAL_BASED_OUTPATIENT_CLINIC_OR_DEPARTMENT_OTHER): Payer: Medicare Other | Admitting: Internal Medicine

## 2011-09-16 DIAGNOSIS — C3490 Malignant neoplasm of unspecified part of unspecified bronchus or lung: Secondary | ICD-10-CM

## 2011-09-16 DIAGNOSIS — C801 Malignant (primary) neoplasm, unspecified: Secondary | ICD-10-CM

## 2011-09-16 DIAGNOSIS — C7949 Secondary malignant neoplasm of other parts of nervous system: Secondary | ICD-10-CM

## 2011-09-16 DIAGNOSIS — C7931 Secondary malignant neoplasm of brain: Secondary | ICD-10-CM

## 2011-09-16 NOTE — Telephone Encounter (Signed)
Gv pt appts for april2013.  scheduled ct scan for 04/15 @ [facility]

## 2011-09-16 NOTE — Telephone Encounter (Signed)
per pt nurse will call and set up Neuro appt for pt

## 2011-09-16 NOTE — Progress Notes (Signed)
[facility] OFFICE PROGRESS NOTE  Jaclyn Shaggy, MD [address]   [place] [address]  DIAGNOSIS: Metastatic non-small cell lung cancer, adenocarcinoma, initially diagnosed as stage IB in July of 2011, now with metastatic brain lesion.   PRIOR THERAPY: Status post right upper lobectomy with lymph node dissection under the care of Dr. Dorris Fetch on 04/10/2010. The patient refused adjuvant chemotherapy.   CURRENT THERAPY: None.   INTERVAL HISTORY: [name] 77 y.o. male returns to the clinic today for followup visit accompanied his daughter. The patient is feeling fine today no specific complaints. He has a good appetite with the steroid treatment. He was seen recently by Dr. Basilio Cairo who recommended for him surgical resection of his brain tumor. This has been awaiting to hear from Dr. Wynetta Emery. He denied having any other significant complaints today. He has a PET scan performed recently and he is here today for evaluation and discussion of his scan results. He still on Decadron 4 mg by mouth every 6 hours.   MEDICAL HISTORY: Past Medical History  Diagnosis Date  . CAD (coronary artery disease)     -s/p Cypher drug eluting stent to LCX for unstable angina in 2006  . Renal artery stenosis   . HTN (hypertension)   . Hypercholesterolemia   . Renal insufficiency     baseline Cr 1.5-1.8  . PAD (peripheral artery disease)   . 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 Torticollis   . Opacity, cornea, central     RUL opacity on CXR 9/10 - refused CT  . Adenocarcinoma     Right upper lobe   . Coordination impairment     Right Parietal Mass  . Weakness 08/20/11    Brain Mets - Left Leg and Arm Left pronator drift  . Brain cancer      Mets - Right Parietal mass - Vasogenic Edema    ALLERGIES:  is allergic to sulfonamide derivatives.  MEDICATIONS:  Current Outpatient Prescriptions  Medication Sig Dispense Refill  . aliskiren  (TEKTURNA) 300 MG tablet Take 300 mg by mouth daily.       Marland Kitchen amLODipine (NORVASC) 10 MG tablet Take 10 mg by mouth daily.        Marland Kitchen aspirin (ASPIR-81) 81 MG EC tablet Take 81 mg by mouth daily.        . carvedilol (COREG) 25 MG tablet Take 25 mg by mouth 2 (two) times daily.        . cloNIDine (CATAPRES) 0.3 MG tablet Take 1 tablet (0.3 mg total) by mouth 2 (two) times daily.  60 tablet  6  . clopidogrel (PLAVIX) 75 MG tablet Take 75 mg by mouth daily.        Marland Kitchen docusate sodium (COLACE) 100 MG capsule Take 100 mg by mouth 2 (two) times daily.        Marland Kitchen doxazosin (CARDURA) 1 MG tablet Take 1 tablet (1 mg total) by mouth at bedtime.  15 tablet  2  . hydrochlorothiazide 25 MG tablet Take 25 mg by mouth daily.        Marland Kitchen LORazepam (ATIVAN) 0.5 MG tablet Take 0.5 mg by mouth at bedtime as needed.        . simvastatin (ZOCOR) 40 MG tablet Take 40 mg by mouth at bedtime.        . Tamsulosin HCl (FLOMAX) 0.4 MG CAPS Take 0.4 mg by mouth daily.  SURGICAL HISTORY:  Past Surgical History  Procedure Date  . Coronary stent placement   . Bronchoscopy 04/10/2010    [facility]  . Rt vats, rt upper lobectomy, mediastinal lymph node dissection 04/10/2010    [facility]  . Left kidney stent placement     REVIEW OF SYSTEMS:  A comprehensive review of systems was negative except for: Neurological: positive for weakness   PHYSICAL EXAMINATION: General appearance: alert, cooperative, appears stated age and no distress Head: Normocephalic, without obvious abnormality, atraumatic Neck: no adenopathy Lymph nodes: Cervical, supraclavicular, and axillary nodes normal. Resp: clear to auscultation bilaterally Cardio: regular rate and rhythm, S1, S2 normal, no murmur, click, rub or gallop GI: soft, non-tender; bowel sounds normal; no masses,  no organomegaly Extremities: extremities normal, atraumatic, no cyanosis or edema Neurologic: Motor: 3/5 muscle strength LUE and LLE.   ECOG PERFORMANCE STATUS: 1 -  Symptomatic but completely ambulatory  Blood pressure 155/77, pulse 84, temperature 98 F (36.7 C), temperature source Oral, height 5\' 10"  (1.778 m), weight 134 lb 1.6 oz (60.827 kg).  LABORATORY DATA: Lab Results  Component Value Date   WBC 15.8* 09/05/2011   HGB 10.7* 09/05/2011   HCT 32.1* 09/05/2011   MCV 93.2 09/05/2011   PLT 199 09/05/2011      Chemistry      Component Value Date/Time   NA 142 09/05/2011 1118   K 4.7 09/05/2011 1118   CL 108 09/05/2011 1118   CO2 26 09/05/2011 1118   BUN 55* 09/05/2011 1118   CREATININE 1.79* 09/05/2011 1118      Component Value Date/Time   CALCIUM 9.2 09/05/2011 1118   ALKPHOS 37* 09/05/2011 1118   AST 12 09/05/2011 1118   ALT 16 09/05/2011 1118   BILITOT 0.3 09/05/2011 1118       RADIOGRAPHIC STUDIES: [name] ZO Contrast  08-26-2011  *RADIOLOGY REPORT*  Clinical Data: New brain mass.  History of lung cancer. Worsening left-sided tremors and weakness in the arm and leg.  MRI HEAD WITHOUT AND WITH CONTRAST  Technique:  Multiplanar, multiecho pulse sequences of the brain and surrounding structures were obtained according to standard protocol without and with intravenous contrast  Contrast: 6mL MULTIHANCE GADOBENATE DIMEGLUMINE 529 MG/ML IV SOLN Half dose was used due to low GFR of 39.  Comparison: CT head 08/20/2011.  Findings: The patient was moderately uncooperative and could not remain motionless for the study.  Images are suboptimal.  Small or subtle lesions could be overlooked.  No acute stroke, hydrocephalus, or extra-axial fluid.  As suspected from CT, there is a right posterior frontal intra- axial metastasis displaying central necrosis, acute hemorrhage, and post contrast enhancement. I believe the lesion's epicenter is the precentral cortex but it is possible that the lesion  straddles the central sulcus or maybe even involves the anterior parietal lobe post central cortex. Cross-sectional measurements are 26 x 30 x 27 mm.  Within limits of detection by this  study which is significantly motion degraded, there are no definite other lesions. Although the lesion is peripherally located, I do not believe it is a meningioma.  There is significant vasogenic edema.  There is approximately 3 mm of right-to-left shift. The acute hemorrhage is peripherally located and likely petechial in nature; there is no dominant central clot.  Mild atrophy is present.  There is moderate white matter signal abnormality likely representing post treatment effect or chronic microvascular ischemic change from hypertension.  The carotid and basilar arteries are patent based on flow void  signal.  There is no definite calvarial or skull base metastasis.  Air-fluid levels in the maxillary sinuses suggest acuity. Significant fluid accumulation in both middle ears and mastoids is noted without visible nasopharyngeal mass.  No definite abnormal contrast enhancement within the temporal bone.  Mastoid effusions versus mastoiditis cannot clearly be distinguished.  Correlate clinically. Right optic globe prosthesis.  IMPRESSION: 26 x 30 x 27 mm right posterior frontal mass near the vertex with vasogenic edema, petechial hemorrhage, and central necrosis; this is consistent with an apparent solitary metastasis from lung cancer.  Motion degraded images however could obscure other foci of small intracranial metastatic disease.  3 mm right-to-left shift. Bilateral mastoid fluid of uncertain significance. Bilateral acute maxillary sinus disease.  The patient has appointments to see Dr. Arbutus Ped and Dr. Wynetta Emery within the next 24 hours.  Original Report Authenticated By: Elsie Stain, M.D.   Nm Pet Image Restag (ps) Skull Base To Thigh  09/10/2011  *RADIOLOGY REPORT*  Clinical Data: Subsequent treatment strategy for lung carcinoma.  NUCLEAR MEDICINE PET CT RESTAGING (PS) SKULL BASE TO THIGH  Technique:  14.5 mCi F-18 FDG was injected intravenously via the right arm.  Full-ring PET imaging was performed from the  skull base through the mid-thighs 60  minutes after injection.  CT data was obtained and used for attenuation correction and anatomic localization only.  (This was not acquired as a diagnostic CT examination.)  Fasting Blood Glucose:  145  Patient Weight:  137 pounds.  Comparison: 02/07/2010  Findings: Postop changes are seen from right upper lobectomy.  No residual right lung mass or hypermetabolic activity identified.  No hypermetabolic lymphadenopathy within the thorax.  Again seen are three foci of ground-glass opacity, two in the left upper lobe and one in the anterior left lower lobe.  These show no significant change in size and show no significant hypermetabolic activity.  These remain suspicious for adenocarcinoma in situ. A slowly enlarging 5 mm pulmonary nodule is seen in the left lung apex, which is new since prior PET CT but is stable compared with more recent chest CT on 06/18/2011.  This shows no hypermetabolic activity but is below size threshold for PET detection of malignancy.  No hypermetabolic masses or lymphadenopathy identified within the neck or abdomen.  A 3.9 cm abdominal aortic aneurysm remains stable as well as a large right renal cyst.  In the pelvis, there is a persistent focal area of hypermetabolic activity in the right posterolateral rectal wall which corresponds with a somewhat polypoid soft tissue density seen on corresponding CT images.  This has increased in prominence of hypermetabolic activity since the previous study, with an SUV max measuring 8.2 compared with 4.8 on previous study.  This raises suspicion for rectal carcinoma, and colonoscopy is recommended for direct visualization.  Enlarged prostate gland and diffuse bladder wall thickening remains stable and shows no associated hypermetabolic activity.  IMPRESSION:  1.  No evidence of postoperative recurrence or metastatic disease. 2.  Three stable foci of ground-glass opacity in the left lung, which show no significant  hypermetabolic activity, but remains suspicious for adenocarcinoma in situ. 3.  Stable 5 mm pulmonary nodule in the left lung apex.  This shows no hypermetabolic activity, but is below size threshold for PET detection of malignancy. Continued follow-up by CT is recommended. 4.  Increased focal hypermetabolic activity in the right posterolateral rectal wall, suspicious for rectal carcinoma; colonoscopy is recommended for direct visualization. 5.  Stable abdominal aortic aneurysm.  Stable  enlarged prostate and probable chronic bladder outlet obstruction.  Original Report Authenticated By: Danae Orleans, M.D.    ASSESSMENT: This is a very pleasant 77 years old white male with metastatic non-small cell lung cancer with solitary brain metastasis and no other evidence for metastatic disease except for hypermetabolically active lesion in the rectum suspicious for primary rectal mass. I have a lengthy discussion with the patient and his daughter today about his PET scan.  PLAN:  #1 we'll refer the patient back to Dr. Wynetta Emery for consideration of surgical resection of his brain tumor. #2 the patient would benefit from adjuvant radiotherapy to the tumor cavity after resection. He is already seen by Dr. Basilio Cairo. #3 I recommend for this referral to gastroenterology for evaluation of the rectal mass and colonoscopy but he would like to hold of this option until after his brain surgery. He would interferon from his primary care physician to gastroenterology after his brain surgery. #4 I will see the patient back for followup visit in 3 months with repeat CT scan of the head and chest for evaluation of his disease.  The patient was advised to call me immediately if has any concerning symptoms in the interval.   All questions were answered. The patient knows to call the clinic with any problems, questions or concerns. We can certainly see the patient much sooner if necessary.

## 2011-09-17 ENCOUNTER — Other Ambulatory Visit: Payer: Self-pay | Admitting: Internal Medicine

## 2011-09-17 DIAGNOSIS — C7931 Secondary malignant neoplasm of brain: Secondary | ICD-10-CM

## 2011-09-17 MED ORDER — DEXAMETHASONE 4 MG PO TABS: 4.0000 mg | ORAL_TABLET | Freq: Four times a day (QID) | ORAL | Status: DC

## 2011-09-17 NOTE — Telephone Encounter (Signed)
Pt requests decadron refill . Called to pharmacy and pt notified of refill.

## 2011-09-19 ENCOUNTER — Ambulatory Visit: Payer: Medicare Other | Admitting: Internal Medicine

## 2011-09-19 ENCOUNTER — Other Ambulatory Visit: Payer: Medicare Other

## 2011-09-23 NOTE — Pre-Procedure Instructions (Addendum)
[address] KARDELL VIRGIL  09/23/2011   Your procedure is scheduled on:  Monday, January 28th  Report to [facility] at 8:00 AM.  Call this number if you have problems the morning of surgery: [phone]   Remember:   Do not eat food:After Midnight Sunday  May have clear liquids: up to 4 Hours before arrival-  4:00 AM.  Clear liquids include soda, tea, black coffee, apple or grape juice, broth.   Take these medicines the morning of surgery with A SIP OF WATER: Clonidine, Decadron, Tekturna, Norvasc, Coreg   Do not wear jewelry, make-up or nail polish.  Do not wear lotions, powders, or perfumes. You may wear deodorant.  Do not shave 48 hours prior to surgery. (WOMEN ONLY)  Do not bring valuables to the hospital.  Contacts, dentures or bridgework may not be worn into surgery.  Leave suitcase in the car. After surgery it may be brought to your room.  For patients admitted to the hospital, checkout time is 11:00 AM the day of discharge.   Patients discharged the day of surgery will not be allowed to drive home.  Name and phone number of your driver: NA  Special Instructions: CHG Shower Use Special Wash: 1/2 bottle night before surgery and 1/2 bottle morning of surgery.   Please read over the following fact sheets that you were given: Pain Booklet, Coughing and Deep Breathing, Blood Transfusion Information and Surgical Site Infection Prevention

## 2011-09-24 ENCOUNTER — Encounter (HOSPITAL_COMMUNITY): Payer: Self-pay | Admitting: Respiratory Therapy

## 2011-09-24 ENCOUNTER — Other Ambulatory Visit (HOSPITAL_COMMUNITY): Payer: Medicare Other

## 2011-09-24 ENCOUNTER — Encounter (HOSPITAL_COMMUNITY)
Admission: RE | Admit: 2011-09-24 | Discharge: 2011-09-24 | Disposition: A | Discharge status: Home / Self Care | Payer: Medicare Other | Source: Ambulatory Visit | Attending: Neurosurgery | Admitting: Neurosurgery

## 2011-09-24 ENCOUNTER — Encounter (HOSPITAL_COMMUNITY): Payer: Self-pay

## 2011-09-24 LAB — PROTIME-INR
INR: 1.09 (ref 0.00–1.49)
Prothrombin Time: 14.3 seconds (ref 11.6–15.2)

## 2011-09-24 LAB — BASIC METABOLIC PANEL
Calcium: 9.8 mg/dL (ref 8.4–10.5)
Creatinine, Ser: 1.69 mg/dL — ABNORMAL HIGH (ref 0.50–1.35)
GFR calc Af Amer: 44 mL/min — ABNORMAL LOW (ref >90)

## 2011-09-24 LAB — SURGICAL PCR SCREEN
MRSA, PCR: NEGATIVE
Staphylococcus aureus: POSITIVE — AB

## 2011-09-24 LAB — CBC
Hemoglobin: 11.3 g/dL — ABNORMAL LOW (ref 13.0–17.0)
RBC: 3.67 MIL/uL — ABNORMAL LOW (ref 4.22–5.81)

## 2011-09-24 LAB — TYPE AND SCREEN: Antibody Screen: NEGATIVE

## 2011-09-24 NOTE — Progress Notes (Signed)
Chart given to anesthesia for review of labs.

## 2011-09-25 ENCOUNTER — Encounter (HOSPITAL_COMMUNITY): Payer: Self-pay | Admitting: Pharmacy Technician

## 2011-09-25 NOTE — Consult Note (Addendum)
Anesthesia:  Patient is a 77 year old male scheduled for a right frontal parietal stealth craniotomy for a right parietal mass on 09/30/11. Other history includes CAD s/p left CX DES 2006, renal artery stenosis s/p left renal stent, HTN, hypercholesterolemia, CRI, PAD, torticollis, smoking, lung CA s/p right upper lobectomy 04/2010, enucleation right eye, and 4.3 cm AAA by 03/26/11 u/s.  Labs noted.  BUN is 78, Cr 1.69.  He has known CRI with a baseline Cr on 1.6-1.8.  WBC 14.5 (on Decadron). H/H 11.3/32.7.  Coags WNL.  Glucose 141.  T&S was done.  CXR from 08/20/11 shows COPD changes in the lungs. Chronic changes with scarring in the right lung base. No confluent or acute opacities. Heart is normal size. No effusions or bony abnormality. No active disease.  EKG shows SR with a single PVC, borderline RAD.  No CV symptoms were reported at PAT.  His Cardiologist is Dr. Gala Romney.  His last OV appears to have been on 12/19/09.  I do not see any Cardiac studies since his heart cath in 08/2005 when he had his DES placed.  I reviewed his cardiac history with Dr. Gelene Mink.  Will notify [facility] tomorrow morning (office now closed) of patient's planned procedure and see if they feel patient should be seen preoperatively.    Addendum: 09/25/10 0930  I spoke with Dr. Gala Romney this morning about patient's upcoming surgery.  He feels that since Mr. Rijos tolerated a RU lobectomy in 2011, he would not delay his up-coming craniotomy for pre-operative Cardiology evaluation.

## 2011-09-30 ENCOUNTER — Other Ambulatory Visit (HOSPITAL_COMMUNITY): Payer: Medicare Other

## 2011-09-30 ENCOUNTER — Inpatient Hospital Stay (HOSPITAL_COMMUNITY): Payer: Medicare Other | Admitting: Vascular Surgery

## 2011-09-30 ENCOUNTER — Inpatient Hospital Stay (HOSPITAL_COMMUNITY)
Admission: RE | Admit: 2011-09-30 | Discharge: 2011-10-03 | DRG: 025 | Disposition: A | Discharge status: Home / Self Care | Payer: Medicare Other | Source: Ambulatory Visit | Attending: Neurosurgery | Admitting: Neurosurgery

## 2011-09-30 ENCOUNTER — Encounter (HOSPITAL_COMMUNITY): Payer: Self-pay | Admitting: Vascular Surgery

## 2011-09-30 ENCOUNTER — Encounter (HOSPITAL_COMMUNITY)
Admission: RE | Disposition: A | Discharge status: Home / Self Care | Payer: Self-pay | Source: Ambulatory Visit | Attending: Neurosurgery

## 2011-09-30 ENCOUNTER — Inpatient Hospital Stay (HOSPITAL_COMMUNITY): Payer: Medicare Other

## 2011-09-30 ENCOUNTER — Encounter (HOSPITAL_COMMUNITY): Payer: Self-pay | Admitting: Certified Registered Nurse Anesthetist

## 2011-09-30 ENCOUNTER — Other Ambulatory Visit: Payer: Self-pay | Admitting: Neurosurgery

## 2011-09-30 DIAGNOSIS — Z9861 Coronary angioplasty status: Secondary | ICD-10-CM

## 2011-09-30 DIAGNOSIS — C7931 Secondary malignant neoplasm of brain: Principal | ICD-10-CM | POA: Diagnosis present

## 2011-09-30 DIAGNOSIS — Z85118 Personal history of other malignant neoplasm of bronchus and lung: Secondary | ICD-10-CM

## 2011-09-30 DIAGNOSIS — Z79899 Other long term (current) drug therapy: Secondary | ICD-10-CM

## 2011-09-30 DIAGNOSIS — Z882 Allergy status to sulfonamides status: Secondary | ICD-10-CM

## 2011-09-30 DIAGNOSIS — I10 Essential (primary) hypertension: Secondary | ICD-10-CM | POA: Diagnosis present

## 2011-09-30 DIAGNOSIS — I739 Peripheral vascular disease, unspecified: Secondary | ICD-10-CM | POA: Diagnosis present

## 2011-09-30 DIAGNOSIS — Z7902 Long term (current) use of antithrombotics/antiplatelets: Secondary | ICD-10-CM

## 2011-09-30 DIAGNOSIS — F17200 Nicotine dependence, unspecified, uncomplicated: Secondary | ICD-10-CM | POA: Diagnosis present

## 2011-09-30 DIAGNOSIS — I2510 Atherosclerotic heart disease of native coronary artery without angina pectoris: Secondary | ICD-10-CM | POA: Diagnosis present

## 2011-09-30 DIAGNOSIS — Z01812 Encounter for preprocedural laboratory examination: Secondary | ICD-10-CM

## 2011-09-30 DIAGNOSIS — Z7982 Long term (current) use of aspirin: Secondary | ICD-10-CM

## 2011-09-30 DIAGNOSIS — G936 Cerebral edema: Secondary | ICD-10-CM | POA: Diagnosis present

## 2011-09-30 DIAGNOSIS — E7800 Pure hypercholesterolemia, unspecified: Secondary | ICD-10-CM | POA: Diagnosis present

## 2011-09-30 DIAGNOSIS — C7949 Secondary malignant neoplasm of other parts of nervous system: Principal | ICD-10-CM | POA: Diagnosis present

## 2011-09-30 HISTORY — PX: CRANIOTOMY: SHX93

## 2011-09-30 SURGERY — CRANIOTOMY TUMOR EXCISION
Anesthesia: General | Site: Head | Laterality: Right | Wound class: Clean

## 2011-09-30 MED ORDER — CEFAZOLIN SODIUM 1-5 GM-% IV SOLN
INTRAVENOUS | Status: AC
  Filled 2011-09-30: qty 50

## 2011-09-30 MED ORDER — VECURONIUM BROMIDE 10 MG IV SOLR
INTRAVENOUS | Status: DC | PRN
  Administered 2011-09-30: 2 mg via INTRAVENOUS
  Administered 2011-09-30: 1 mg via INTRAVENOUS

## 2011-09-30 MED ORDER — PHENYLEPHRINE HCL 10 MG/ML IJ SOLN
10.0000 mg | INTRAVENOUS | Status: DC | PRN
  Administered 2011-09-30 (×2): 10 ug/min via INTRAVENOUS

## 2011-09-30 MED ORDER — DOXAZOSIN MESYLATE 1 MG PO TABS
1.0000 mg | ORAL_TABLET | Freq: Every day | ORAL | Status: DC
  Administered 2011-09-30 – 2011-10-02 (×3): 1 mg via ORAL
  Filled 2011-09-30 (×4): qty 1

## 2011-09-30 MED ORDER — GADOBENATE DIMEGLUMINE 529 MG/ML IV SOLN
6.0000 mL | Freq: Once | INTRAVENOUS | Status: AC | PRN
  Administered 2011-09-30: 6 mL via INTRAVENOUS

## 2011-09-30 MED ORDER — HEMOSTATIC AGENTS (NO CHARGE) OPTIME
TOPICAL | Status: DC | PRN
  Administered 2011-09-30: 1 via TOPICAL

## 2011-09-30 MED ORDER — TAMSULOSIN HCL 0.4 MG PO CAPS
0.4000 mg | ORAL_CAPSULE | Freq: Every day | ORAL | Status: DC
  Administered 2011-09-30 – 2011-10-02 (×3): 0.4 mg via ORAL
  Filled 2011-09-30 (×4): qty 1

## 2011-09-30 MED ORDER — HYDRALAZINE HCL 20 MG/ML IJ SOLN
INTRAMUSCULAR | Status: AC
  Administered 2011-09-30: 5 mg via INTRAVENOUS
  Filled 2011-09-30: qty 1

## 2011-09-30 MED ORDER — GLYCOPYRROLATE 0.2 MG/ML IJ SOLN
INTRAMUSCULAR | Status: DC | PRN
  Administered 2011-09-30: .6 mg via INTRAVENOUS

## 2011-09-30 MED ORDER — ACETAMINOPHEN 650 MG RE SUPP: 650.0000 mg | RECTAL | Status: DC | PRN

## 2011-09-30 MED ORDER — CHLORHEXIDINE GLUCONATE CLOTH 2 % EX PADS
6.0000 | MEDICATED_PAD | Freq: Every day | CUTANEOUS | Status: DC
  Administered 2011-09-30 – 2011-10-02 (×3): 6 via TOPICAL

## 2011-09-30 MED ORDER — SENNOSIDES-DOCUSATE SODIUM 8.6-50 MG PO TABS
1.0000 | ORAL_TABLET | Freq: Two times a day (BID) | ORAL | Status: DC
  Administered 2011-09-30 – 2011-10-02 (×5): 1 via ORAL
  Filled 2011-09-30 (×5): qty 1

## 2011-09-30 MED ORDER — SIMVASTATIN 40 MG PO TABS
40.0000 mg | ORAL_TABLET | Freq: Every day | ORAL | Status: DC
  Administered 2011-09-30 – 2011-10-02 (×3): 40 mg via ORAL
  Filled 2011-09-30 (×4): qty 1

## 2011-09-30 MED ORDER — HYDROMORPHONE HCL PF 1 MG/ML IJ SOLN: 0.2500 mg | INTRAMUSCULAR | Status: DC | PRN

## 2011-09-30 MED ORDER — ROCURONIUM BROMIDE 100 MG/10ML IV SOLN
INTRAVENOUS | Status: DC | PRN
  Administered 2011-09-30: 50 mg via INTRAVENOUS

## 2011-09-30 MED ORDER — ONDANSETRON HCL 4 MG/2ML IJ SOLN
INTRAMUSCULAR | Status: DC | PRN
  Administered 2011-09-30: 4 mg via INTRAVENOUS

## 2011-09-30 MED ORDER — CLONIDINE HCL 0.3 MG PO TABS
0.3000 mg | ORAL_TABLET | Freq: Two times a day (BID) | ORAL | Status: DC
  Administered 2011-09-30 – 2011-10-02 (×5): 0.3 mg via ORAL
  Filled 2011-09-30 (×7): qty 1

## 2011-09-30 MED ORDER — DOCUSATE SODIUM 100 MG PO CAPS
100.0000 mg | ORAL_CAPSULE | Freq: Two times a day (BID) | ORAL | Status: DC
  Administered 2011-09-30 – 2011-10-02 (×5): 100 mg via ORAL
  Filled 2011-09-30 (×5): qty 1

## 2011-09-30 MED ORDER — MEPERIDINE HCL 25 MG/ML IJ SOLN: 6.2500 mg | INTRAMUSCULAR | Status: DC | PRN

## 2011-09-30 MED ORDER — SODIUM CHLORIDE 0.9 % IV SOLN
INTRAVENOUS | Status: DC
  Administered 2011-09-30 – 2011-10-02 (×3): via INTRAVENOUS

## 2011-09-30 MED ORDER — SODIUM CHLORIDE 0.9 % IV SOLN
INTRAVENOUS | Status: AC
  Administered 2011-09-30 (×2): via INTRAVENOUS
  Filled 2011-09-30: qty 500

## 2011-09-30 MED ORDER — NEOSTIGMINE METHYLSULFATE 1 MG/ML IJ SOLN
INTRAMUSCULAR | Status: DC | PRN
  Administered 2011-09-30: 4 mg via INTRAVENOUS

## 2011-09-30 MED ORDER — ONDANSETRON HCL 4 MG/2ML IJ SOLN: 4.0000 mg | Freq: Once | INTRAMUSCULAR | Status: DC | PRN

## 2011-09-30 MED ORDER — CARVEDILOL 25 MG PO TABS
25.0000 mg | ORAL_TABLET | Freq: Two times a day (BID) | ORAL | Status: DC
  Administered 2011-09-30 – 2011-10-02 (×5): 25 mg via ORAL
  Filled 2011-09-30 (×7): qty 1

## 2011-09-30 MED ORDER — PROPOFOL 10 MG/ML IV EMUL
INTRAVENOUS | Status: DC | PRN
  Administered 2011-09-30: 150 mg via INTRAVENOUS

## 2011-09-30 MED ORDER — ONDANSETRON HCL 4 MG/2ML IJ SOLN: 4.0000 mg | Freq: Four times a day (QID) | INTRAMUSCULAR | Status: DC | PRN

## 2011-09-30 MED ORDER — AMLODIPINE BESYLATE 10 MG PO TABS
10.0000 mg | ORAL_TABLET | Freq: Every day | ORAL | Status: DC
  Administered 2011-09-30 – 2011-10-02 (×3): 10 mg via ORAL
  Filled 2011-09-30 (×4): qty 1

## 2011-09-30 MED ORDER — MICROFIBRILLAR COLL HEMOSTAT EX PADS
MEDICATED_PAD | CUTANEOUS | Status: DC | PRN
  Administered 2011-09-30: 1 via TOPICAL

## 2011-09-30 MED ORDER — FENTANYL CITRATE 0.05 MG/ML IJ SOLN
INTRAMUSCULAR | Status: DC | PRN
  Administered 2011-09-30: 150 ug via INTRAVENOUS

## 2011-09-30 MED ORDER — SODIUM CHLORIDE 0.9 % IR SOLN
Status: DC | PRN
  Administered 2011-09-30: 13:00:00

## 2011-09-30 MED ORDER — CEFAZOLIN SODIUM 1-5 GM-% IV SOLN
INTRAVENOUS | Status: DC | PRN
  Administered 2011-09-30: 1 g via INTRAVENOUS

## 2011-09-30 MED ORDER — MORPHINE SULFATE 2 MG/ML IJ SOLN: 0.0500 mg/kg | INTRAMUSCULAR | Status: DC | PRN

## 2011-09-30 MED ORDER — HYDROCHLOROTHIAZIDE 25 MG PO TABS
25.0000 mg | ORAL_TABLET | Freq: Every day | ORAL | Status: DC
  Administered 2011-09-30 – 2011-10-02 (×3): 25 mg via ORAL
  Filled 2011-09-30 (×4): qty 1

## 2011-09-30 MED ORDER — THROMBIN 20000 UNITS EX KIT
PACK | CUTANEOUS | Status: DC | PRN
  Administered 2011-09-30: 13:00:00 via TOPICAL

## 2011-09-30 MED ORDER — DEXAMETHASONE 4 MG PO TABS: 4.0000 mg | ORAL_TABLET | Freq: Two times a day (BID) | ORAL | Status: DC

## 2011-09-30 MED ORDER — PHENYLEPHRINE HCL 10 MG/ML IJ SOLN
INTRAMUSCULAR | Status: DC | PRN
  Administered 2011-09-30: 80 ug via INTRAVENOUS

## 2011-09-30 MED ORDER — ACETAMINOPHEN-CODEINE #3 300-30 MG PO TABS
1.0000 | ORAL_TABLET | ORAL | Status: DC | PRN
  Administered 2011-09-30: 1 via ORAL
  Filled 2011-09-30: qty 1

## 2011-09-30 MED ORDER — CEFAZOLIN SODIUM 1-5 GM-% IV SOLN
1.0000 g | Freq: Three times a day (TID) | INTRAVENOUS | Status: DC
  Administered 2011-09-30 – 2011-10-02 (×6): 1 g via INTRAVENOUS
  Filled 2011-09-30 (×9): qty 50

## 2011-09-30 MED ORDER — DEXAMETHASONE SODIUM PHOSPHATE 10 MG/ML IJ SOLN
10.0000 mg | Freq: Four times a day (QID) | INTRAMUSCULAR | Status: DC
  Administered 2011-09-30 – 2011-10-01 (×3): 10 mg via INTRAVENOUS
  Filled 2011-09-30 (×7): qty 1

## 2011-09-30 MED ORDER — MUPIROCIN 2 % EX OINT
1.0000 "application " | TOPICAL_OINTMENT | Freq: Two times a day (BID) | CUTANEOUS | Status: DC
  Administered 2011-09-30 – 2011-10-02 (×5): 1 via NASAL
  Filled 2011-09-30 (×2): qty 22

## 2011-09-30 MED ORDER — SODIUM CHLORIDE 0.9 % IR SOLN
Status: DC | PRN
  Administered 2011-09-30: 1000 mL

## 2011-09-30 MED ORDER — DEXAMETHASONE SODIUM PHOSPHATE 4 MG/ML IJ SOLN
INTRAMUSCULAR | Status: DC | PRN
  Administered 2011-09-30: 10 mg via INTRAVENOUS

## 2011-09-30 MED ORDER — ACETAMINOPHEN 325 MG PO TABS
650.0000 mg | ORAL_TABLET | ORAL | Status: DC | PRN
  Administered 2011-10-01: 650 mg via ORAL
  Filled 2011-09-30: qty 2

## 2011-09-30 MED ORDER — HYDRALAZINE HCL 20 MG/ML IJ SOLN
5.0000 mg | INTRAMUSCULAR | Status: DC | PRN
  Administered 2011-09-30 (×2): 5 mg via INTRAVENOUS
  Filled 2011-09-30: qty 1

## 2011-09-30 MED ORDER — MANNITOL 25 % IV SOLN
INTRAVENOUS | Status: DC | PRN
  Administered 2011-09-30: 25 g via INTRAVENOUS

## 2011-09-30 MED ORDER — BACITRACIN 50000 UNITS IM SOLR
INTRAMUSCULAR | Status: AC
  Filled 2011-09-30: qty 1

## 2011-09-30 MED ORDER — LIDOCAINE-EPINEPHRINE 1 %-1:100000 IJ SOLN
INTRAMUSCULAR | Status: DC | PRN
  Administered 2011-09-30: 20 mL

## 2011-09-30 MED ORDER — PANTOPRAZOLE SODIUM 40 MG IV SOLR
40.0000 mg | Freq: Every day | INTRAVENOUS | Status: DC
  Administered 2011-09-30: 40 mg via INTRAVENOUS
  Filled 2011-09-30 (×2): qty 40

## 2011-09-30 MED ORDER — EPHEDRINE SULFATE 50 MG/ML IJ SOLN
INTRAMUSCULAR | Status: DC | PRN
  Administered 2011-09-30 (×2): 10 mg via INTRAVENOUS

## 2011-09-30 SURGICAL SUPPLY — 94 items
BAG DECANTER FOR FLEXI CONT (MISCELLANEOUS) ×2 IMPLANT
BANDAGE GAUZE 4  KLING STR (GAUZE/BANDAGES/DRESSINGS) ×2 IMPLANT
BLADE SURG 11 STRL SS (BLADE) ×2 IMPLANT
BLADE SURG ROTATE 9660 (MISCELLANEOUS) ×2 IMPLANT
BNDG COHESIVE 4X5 TAN NS LF (GAUZE/BANDAGES/DRESSINGS) IMPLANT
BRUSH SCRUB EZ 1% IODOPHOR (MISCELLANEOUS) ×2 IMPLANT
BRUSH SCRUB EZ PLAIN DRY (MISCELLANEOUS) ×2 IMPLANT
BUR ACORN 9.0 PRECISION (BURR) ×2 IMPLANT
BUR ROUTER D-58 CRANI (BURR) ×2 IMPLANT
CANISTER SUCTION 2500CC (MISCELLANEOUS) ×4 IMPLANT
CLIP TI MEDIUM 6 (CLIP) ×2 IMPLANT
CLOTH BEACON ORANGE TIMEOUT ST (SAFETY) ×2 IMPLANT
CONT SPEC 4OZ CLIKSEAL STRL BL (MISCELLANEOUS) ×4 IMPLANT
CORDS BIPOLAR (ELECTRODE) ×2 IMPLANT
COVER TABLE BACK 60X90 (DRAPES) IMPLANT
DECANTER SPIKE VIAL GLASS SM (MISCELLANEOUS) ×2 IMPLANT
DERMABOND ADVANCED (GAUZE/BANDAGES/DRESSINGS) ×1
DERMABOND ADVANCED .7 DNX12 (GAUZE/BANDAGES/DRESSINGS) ×1 IMPLANT
DRAPE CAMERA VIDEO/LASER (DRAPES) IMPLANT
DRAPE LONG LASER MIC (DRAPES) IMPLANT
DRAPE MICROSCOPE ZEISS OPMI (DRAPES) IMPLANT
DRAPE STERI IOBAN 125X83 (DRAPES) IMPLANT
DRAPE WARM FLUID 44X44 (DRAPE) ×2 IMPLANT
DRSG OPSITE 4X5.5 SM (GAUZE/BANDAGES/DRESSINGS) ×4 IMPLANT
ELECT CAUTERY BLADE 6.4 (BLADE) ×2 IMPLANT
ELECT REM PT RETURN 9FT ADLT (ELECTROSURGICAL) ×2
ELECTRODE REM PT RTRN 9FT ADLT (ELECTROSURGICAL) ×1 IMPLANT
GAUZE SPONGE 4X4 16PLY XRAY LF (GAUZE/BANDAGES/DRESSINGS) IMPLANT
GLOVE BIO SURGEON STRL SZ 6.5 (GLOVE) IMPLANT
GLOVE BIO SURGEON STRL SZ7 (GLOVE) IMPLANT
GLOVE BIO SURGEON STRL SZ7.5 (GLOVE) IMPLANT
GLOVE BIO SURGEON STRL SZ8 (GLOVE) ×2 IMPLANT
GLOVE BIO SURGEON STRL SZ8.5 (GLOVE) IMPLANT
GLOVE BIOGEL M 8.0 STRL (GLOVE) IMPLANT
GLOVE BIOGEL PI IND STRL 7.5 (GLOVE) ×1 IMPLANT
GLOVE BIOGEL PI IND STRL 8.5 (GLOVE) ×1 IMPLANT
GLOVE BIOGEL PI INDICATOR 7.5 (GLOVE) ×1
GLOVE BIOGEL PI INDICATOR 8.5 (GLOVE) ×1
GLOVE ECLIPSE 6.5 STRL STRAW (GLOVE) IMPLANT
GLOVE ECLIPSE 7.0 STRL STRAW (GLOVE) IMPLANT
GLOVE ECLIPSE 7.5 STRL STRAW (GLOVE) ×2 IMPLANT
GLOVE ECLIPSE 8.0 STRL XLNG CF (GLOVE) IMPLANT
GLOVE ECLIPSE 8.5 STRL (GLOVE) IMPLANT
GLOVE EXAM NITRILE LRG STRL (GLOVE) IMPLANT
GLOVE EXAM NITRILE MD LF STRL (GLOVE) ×4 IMPLANT
GLOVE EXAM NITRILE XL STR (GLOVE) IMPLANT
GLOVE EXAM NITRILE XS STR PU (GLOVE) IMPLANT
GLOVE INDICATOR 6.5 STRL GRN (GLOVE) IMPLANT
GLOVE INDICATOR 7.0 STRL GRN (GLOVE) IMPLANT
GLOVE INDICATOR 7.5 STRL GRN (GLOVE) IMPLANT
GLOVE INDICATOR 8.0 STRL GRN (GLOVE) IMPLANT
GLOVE INDICATOR 8.5 STRL (GLOVE) ×2 IMPLANT
GLOVE OPTIFIT SS 8.0 STRL (GLOVE) IMPLANT
GLOVE SURG SS PI 6.5 STRL IVOR (GLOVE) IMPLANT
GLOVE SURG SS PI 8.0 STRL IVOR (GLOVE) ×4 IMPLANT
GOWN BRE IMP SLV AUR LG STRL (GOWN DISPOSABLE) IMPLANT
GOWN BRE IMP SLV AUR XL STRL (GOWN DISPOSABLE) ×2 IMPLANT
GOWN STRL REIN 2XL LVL4 (GOWN DISPOSABLE) ×4 IMPLANT
HEMOSTAT SURGICEL 2X14 (HEMOSTASIS) IMPLANT
HOOK DURA (MISCELLANEOUS) ×2 IMPLANT
KIT BASIN OR (CUSTOM PROCEDURE TRAY) ×2 IMPLANT
KIT ROOM TURNOVER OR (KITS) ×2 IMPLANT
MARKER SKIN DUAL TIP RULER LAB (MISCELLANEOUS) ×2 IMPLANT
MARKER SPHERE PSV REFLC NDI (MISCELLANEOUS) ×4 IMPLANT
NEEDLE HYPO 25X1 1.5 SAFETY (NEEDLE) ×2 IMPLANT
NS IRRIG 1000ML POUR BTL (IV SOLUTION) ×4 IMPLANT
PACK CRANIOTOMY (CUSTOM PROCEDURE TRAY) ×2 IMPLANT
PAD ARMBOARD 7.5X6 YLW CONV (MISCELLANEOUS) ×6 IMPLANT
PAD EYE OVAL STERILE LF (GAUZE/BANDAGES/DRESSINGS) IMPLANT
PATTIES SURGICAL .25X.25 (GAUZE/BANDAGES/DRESSINGS) IMPLANT
PATTIES SURGICAL .5 X.5 (GAUZE/BANDAGES/DRESSINGS) IMPLANT
PATTIES SURGICAL .5 X3 (DISPOSABLE) ×2 IMPLANT
PATTIES SURGICAL 1X1 (DISPOSABLE) IMPLANT
PLATE 1.5/0.5 22MM BURR HO (Plate) ×4 IMPLANT
PLATE 1.5/0.5 25MM BURR HOLE (Plate) ×2 IMPLANT
RUBBERBAND STERILE (MISCELLANEOUS) IMPLANT
SCREW SELF DRILL HT 1.5/4MM (Screw) ×24 IMPLANT
SPONGE GAUZE 4X4 12PLY (GAUZE/BANDAGES/DRESSINGS) ×2 IMPLANT
SPONGE NEURO XRAY DETECT 1X3 (DISPOSABLE) IMPLANT
SPONGE SURGIFOAM ABS GEL 100 (HEMOSTASIS) ×2 IMPLANT
SPONGE SURGIFOAM ABS GEL 12-7 (HEMOSTASIS) IMPLANT
STAPLER VISISTAT 35W (STAPLE) ×2 IMPLANT
SUT ETHILON 3 0 FSL (SUTURE) ×2 IMPLANT
SUT NURALON 4 0 TR CR/8 (SUTURE) ×12 IMPLANT
SUT VIC AB 2-0 CT1 18 (SUTURE) ×6 IMPLANT
SYR 20ML ECCENTRIC (SYRINGE) ×2 IMPLANT
SYR CONTROL 10ML LL (SYRINGE) ×2 IMPLANT
TIP SONASTAR PREC MISONIX 1.1 (TRAY / TRAY PROCEDURE) IMPLANT
TOWEL OR 17X24 6PK STRL BLUE (TOWEL DISPOSABLE) ×2 IMPLANT
TOWEL OR 17X26 10 PK STRL BLUE (TOWEL DISPOSABLE) ×2 IMPLANT
TRAY FOLEY CATH 14FRSI W/METER (CATHETERS) ×2 IMPLANT
TUBE CONNECTING 12X1/4 (SUCTIONS) ×2 IMPLANT
UNDERPAD 30X30 INCONTINENT (UNDERPADS AND DIAPERS) ×2 IMPLANT
WATER STERILE IRR 1000ML POUR (IV SOLUTION) ×2 IMPLANT

## 2011-09-30 NOTE — Anesthesia Preprocedure Evaluation (Addendum)
Anesthesia Evaluation  Patient identified by MRN, date of birth, ID band Patient awake    Reviewed: Allergy & Precautions, H&P , NPO status , Patient's Chart, lab work & pertinent test results  Airway Mallampati: II TM Distance: >3 FB Neck ROM: Full    Dental  (+) Teeth Intact, Dental Advisory Given and Edentulous Upper   Pulmonary COPDCurrent Smoker,  clear to auscultation        Cardiovascular hypertension, Pt. on medications and Pt. on home beta blockers + CAD Regular Normal    Neuro/Psych  Neuromuscular disease    GI/Hepatic   Endo/Other    Renal/GU      Musculoskeletal   Abdominal   Peds  Hematology   Anesthesia Other Findings   Reproductive/Obstetrics                          Anesthesia Physical Anesthesia Plan  ASA: IV  Anesthesia Plan: General   Post-op Pain Management:    Induction: Intravenous  Airway Management Planned: Oral ETT  Additional Equipment: Arterial line  Intra-op Plan:   Post-operative Plan: Extubation in OR and Possible Post-op intubation/ventilation  Informed Consent: I have reviewed the patients History and Physical, chart, labs and discussed the procedure including the risks, benefits and alternatives for the proposed anesthesia with the patient or authorized representative who has indicated his/her understanding and acceptance.   Dental advisory given  Plan Discussed with: Anesthesiologist, CRNA and Surgeon  Anesthesia Plan Comments:         Anesthesia Quick Evaluation

## 2011-09-30 NOTE — Anesthesia Postprocedure Evaluation (Signed)
  Anesthesia Post-op Note  Patient: [name]  Procedure(s) Performed:  CRANIOTOMY TUMOR EXCISION - Right Frontal Parietal Stealth Craniotomy  Patient Location: PACU  Anesthesia Type: General  Level of Consciousness: awake and alert   Airway and Oxygen Therapy: Patient Spontanous Breathing and Patient connected to nasal cannula oxygen  Post-op Pain: mild  Post-op Assessment: Post-op Vital signs reviewed, Patient's Cardiovascular Status Stable, Respiratory Function Stable, Patent Airway, No signs of Nausea or vomiting and Pain level controlled  Post-op Vital Signs: Reviewed and stable  Complications: No apparent anesthesia complications

## 2011-09-30 NOTE — H&P (Signed)
[name] is an 77 y.o. male.   Chief Complaint: Headaches left-sided weakness HPI: Patient is a 77 year old gentleman with history of adenocarcinoma to the lung diagnosed in August of 2011% with weakness in his left side.difficulty and headaches her cup has revealed a right posterior frontoparietal metastasis. This workup with his oncologist reveals the lung cancer to be relatively controlled and after extensive discussions with them and the patient and family we have elected to proceed forward with excision of the right frontal metastasis extensively gone over the risks and benefits of the operation with the patient and his daughter including perioperative course, alternatives to surgery, expectations of outcome they understand and agrees to proceed forward.  Past Medical History  Diagnosis Date  . CAD (coronary artery disease)     -s/p Cypher drug eluting stent to LCX for unstable angina in 2006  . Renal artery stenosis   . HTN (hypertension)   . Hypercholesterolemia   . Renal insufficiency     baseline Cr 1.5-1.8  . PAD (peripheral artery disease)   . 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 Torticollis   . Opacity, cornea, central     RUL opacity on CXR 9/10 - refused CT  . Adenocarcinoma     Right upper lobe   . Coordination impairment     Right Parietal Mass  . Weakness 08/20/11    Brain Mets - Left Leg and Arm Left pronator drift  . Brain cancer      Mets - Right Parietal mass - Vasogenic Edema    Past Surgical History  Procedure Date  . Coronary stent placement   . Bronchoscopy 04/10/2010    [facility]  . Rt vats, rt upper lobectomy, mediastinal lymph node dissection 04/10/2010    [facility]  . Left kidney stent placement   . Hemorroidectomy     Family History  Problem Relation Age of Onset  . Breast cancer Mother   . Peripheral vascular disease Sister     requiring on amputation  . Aneurysm Father    Social History:   reports that he has been smoking Cigarettes.  He has a 28 pack-year smoking history. He does not have any smokeless tobacco history on file. He reports that he does not drink alcohol or use illicit drugs.  Allergies:  Allergies  Allergen Reactions  . Sulfonamide Derivatives Hives    Medications Prior to Admission  Medication Dose Route Frequency Provider Last Rate Last Dose  . bacitracin 45409 UNITS injection           . gadobenate dimeglumine (MULTIHANCE) injection 6 mL  6 mL Intravenous Once PRN Fuller Canada, MD   6 mL at 09/30/11 1010  . sodium chloride 0.9 % infusion            Medications Prior to Admission  Medication Sig Dispense Refill  . aliskiren (TEKTURNA) 300 MG tablet Take 300 mg by mouth daily.       Marland Kitchen amLODipine (NORVASC) 10 MG tablet Take 10 mg by mouth daily.       Marland Kitchen aspirin (ASPIR-81) 81 MG EC tablet Take 81 mg by mouth daily.        . carvedilol (COREG) 25 MG tablet Take 25 mg by mouth 2 (two) times daily.        . clopidogrel (PLAVIX) 75 MG tablet Take 75 mg by mouth daily.        Marland Kitchen docusate sodium (  COLACE) 100 MG capsule Take 100 mg by mouth 2 (two) times daily.        . hydrochlorothiazide 25 MG tablet Take 25 mg by mouth daily.        Marland Kitchen LORazepam (ATIVAN) 0.5 MG tablet Take 0.5 mg by mouth at bedtime as needed.       . simvastatin (ZOCOR) 40 MG tablet Take 40 mg by mouth at bedtime.        . Tamsulosin HCl (FLOMAX) 0.4 MG CAPS Take 0.4 mg by mouth daily.          No results found for this or any previous visit (from the past 48 hour(s)). Mr [name] FA Contrast  09/30/2011  *RADIOLOGY REPORT*  Clinical Data: Lung cancer.  Preop stealth protocol for craniotomy.  MRI HEAD WITHOUT AND WITH CONTRAST  Technique:  Multiplanar, multiecho pulse sequences of the brain and surrounding structures were obtained according to standard protocol without and with intravenous contrast  Contrast:  6 ml Multihance.  Comparison: MRI brain 08/21/2011.  Findings: A right posterior  frontal tumor is again noted.  The primary lesion appears slightly smaller than on the prior exam. The study is moderately degraded by patient motion.  The extent of FLAIR hyperintensity surrounding the tumor has diminished greatly.  The postcontrast images demonstrate a second punctate area of enhancement just anterior to the primary lesion.  Fluid is again noted within the mastoid air cells bilaterally. Mucosal thickening is present in the maxillary sinuses with interval decrease in the size of a right maxillary fluid level. The patient is status post resection of the right lobe.  IMPRESSION:  1.  Interval decrease in size of the primary tumor in the posterior right frontal lobe. 2.  A second focal area of enhancement is suspected just anterior to the larger lesion, measuring 7 mm.  Both lesions are compatible with metastatic disease. 3.  Interval decrease in diffuse vasogenic edema.  Original Report Authenticated By: Jamesetta Orleans. MATTERN, M.D.    Review of Systems  Constitutional: Negative.   HENT: Positive for neck pain.   Eyes: Negative.   Respiratory: Positive for shortness of breath.   Cardiovascular: Negative.   Gastrointestinal: Negative.   Skin: Negative.     Blood pressure 123/74, pulse 88, temperature 97.7 F (36.5 C), temperature source Oral, resp. rate 18, SpO2 97.00%. Physical Exam  Constitutional: He is oriented to person, place, and time. He appears well-developed and well-nourished.  HENT:  Head: Normocephalic and atraumatic.  Eyes: Pupils are equal, round, and reactive to light.  Cardiovascular: Regular rhythm.   Respiratory: Effort normal.  GI: Soft.  Neurological: He is alert and oriented to person, place, and time. GCS eye subscore is 4. GCS verbal subscore is 5. GCS motor subscore is 6.  Reflex Scores:      Patellar reflexes are 0 on the right side and 0 on the left side.      Achilles reflexes are 0 on the right side and 0 on the left side.      Patient is awake  and alert he is oriented x3 he has significant difficulty hearing so communication is very difficult. Strength on his right side is 5 out of 5 he has some slight left pronator drift and he has torticollis. His pupils are equal reactive     Assessment/Plan 78 year old gentleman presents with right frontal metastasis stealth stereotactic craniotomy have extensively reviewed the risks benefits of surgery perioperative course and expectations of outcome and alternatives  surgery and the patient and family understand and agree to proceed forward.  Fedora Knisely P 09/30/2011, 11:05 AM

## 2011-09-30 NOTE — Op Note (Signed)
Preoperative diagnosis: right posterior frontoparietal brain tumor  Postoperative diagnosis: Right posterior frontal parietal brain metastasis  Procedure: Stealth stereotactic craniotomy for removal of right posterior frontoparietal brain tumor  Surgeon: Jillyn Hidden Jerilyn Gillaspie  Anesthesia: Gen.  EBL: Minimal  History of present illness: Patient is 77 year old gentleman who has a history of adenocarcinoma of the lung diagnosed back in August of 2011 status post pneumonectomy patient presented with some weakness in his left side headaches and confusion CT scan subsequent MRI scan showed what appeared to be a right posterior frontal parietal metastasis. Further workup showed his lung disease was relatively controlled and patient presents for craniotomy for excision of metastasis. Risks and benefits of the operation were explained to the patient as well as his family as well as perioperative course alternatives to surgery expectations of outcome and should agree to proceed forward.  Operative procedure: Patient was brought into the or was induced under general anesthesia and positioned supine the neck in slight flexion looking straight ahead in a neutral position he was locked in pins Stealth stereotactic system was then registered and confirmed and the lesion was localized a linear incision was drawn out across the midline to the left down to the right parietal boss to just above the ear. After infiltration 10 cc lidocaine with epi a midline incision was made and cerebellar retractors were used to retract the scalp away. A bone flap is then turned centering a burr holes just off the midline and one down inferiorly. The Stealth stereotactic system and in further localize leisure Harvie Heck the center of the craniotomy and the dura was then opened up flat against the superior sagittal sinus. Middle gyrus was markedly expanded and using stealth the gyrus where the tumor had come closest to the surface favoring the posterior  aspect of the tumor was incised after coag lack regulation with bipolar cautery corticectomy was made here tumor was immediately identified. He did have the relatively firm capsule appearance of adenocarcinoma as well as a cystic capsule the inferior heel aspect of the tumor. Using blunt careful blunt dissection with bipolar bayonets 4 Penfield plane around the capsule the tumor was developed and cottonoids to obtain the division between the tumor and the edematous white matter. Working in a 360 orientation sequentially the entirety of the capsule was dissected off of the edematous white matter and the tumor was able to be removed en bloc. The entire tumor was then sent for both frozen and permanent section and frozen initially came back as adenocarcinoma consistent with his lung primary. Meticulous hemostasis was then maintained Surgicel was overlaid top of the corticectomy bed the dura was reapproximated with interrupted Nurolon and the flap was reapproximated with 3 or 3 bur hole covers scalp was then closed with after Vicryl and running nylon in the skin. At the end of case all needle counts sponge counts were correct. The patient was then sent to recovery room.

## 2011-09-30 NOTE — Transfer of Care (Signed)
Immediate Anesthesia Transfer of Care Note  Patient: [name]  Procedure(s) Performed:  CRANIOTOMY TUMOR EXCISION - Right Frontal Parietal Stealth Craniotomy  Patient Location: PACU  Anesthesia Type: General  Level of Consciousness: awake, alert , oriented and patient cooperative  Airway & Oxygen Therapy: Patient Spontanous Breathing and Patient connected to nasal cannula oxygen  Post-op Assessment: Report given to PACU RN, Post -op Vital signs reviewed and stable, Patient moving all extremities X 4 and Patient able to stick tongue midline  Post vital signs: Reviewed and stable  Complications: No apparent anesthesia complications

## 2011-09-30 NOTE — Anesthesia Procedure Notes (Addendum)
Procedure Name: Intubation Date/Time: 09/30/2011 11:59 AM Performed by: Shirlyn Goltz, TOM Pre-anesthesia Checklist: Patient identified, Emergency Drugs available, Suction available, Patient being monitored and Timeout performed Patient Re-evaluated:Patient Re-evaluated prior to inductionOxygen Delivery Method: Circle System Utilized Preoxygenation: Pre-oxygenation with 100% oxygen Intubation Type: IV induction Ventilation: Oral airway inserted - appropriate to patient size and Mask ventilation without difficulty Laryngoscope Size: Miller and 2 Grade View: Grade I Tube type: Oral Tube size: 7.5 mm Number of attempts: 1 Airway Equipment and Method: stylet and LTA Placement Confirmation: ETT inserted through vocal cords under direct vision,  positive ETCO2 and breath sounds checked- equal and bilateral Secured at: 23 cm Tube secured with: Tape Dental Injury: Teeth and Oropharynx as per pre-operative assessment

## 2011-09-30 NOTE — Preoperative (Signed)
Beta Blockers   Reason not to administer Beta Blockers:Not Applicable 

## 2011-09-30 NOTE — Progress Notes (Signed)
Dr. Ivin Booty notified  of elevated BP. No new orders .

## 2011-10-01 ENCOUNTER — Encounter: Payer: Medicare Other | Admitting: *Deleted

## 2011-10-01 ENCOUNTER — Encounter (HOSPITAL_COMMUNITY): Payer: Self-pay | Admitting: Neurosurgery

## 2011-10-01 MED ORDER — PANTOPRAZOLE SODIUM 40 MG PO TBEC
40.0000 mg | DELAYED_RELEASE_TABLET | Freq: Every day | ORAL | Status: DC
  Administered 2011-10-01 – 2011-10-02 (×2): 40 mg via ORAL
  Filled 2011-10-01 (×2): qty 1

## 2011-10-01 MED ORDER — DEXAMETHASONE SODIUM PHOSPHATE 10 MG/ML IJ SOLN
4.0000 mg | Freq: Four times a day (QID) | INTRAMUSCULAR | Status: DC
  Administered 2011-10-01 – 2011-10-02 (×4): 4 mg via INTRAVENOUS
  Filled 2011-10-01: qty 1
  Filled 2011-10-01 (×7): qty 0.4

## 2011-10-01 MED ORDER — BOOST / RESOURCE BREEZE PO LIQD
1.0000 | Freq: Three times a day (TID) | ORAL | Status: DC
  Administered 2011-10-02 – 2011-10-03 (×4): 1 via ORAL

## 2011-10-01 NOTE — Progress Notes (Signed)
Patient is receiving Protonix by the IV route.  Pt meets the P & T approved criteria for changing to oral administration.  - No GI bleeding  - Tolerating an oral or per tube diet  - Taking other oral or per tube medications.  Will change patient to Protonix 40mg PO daily per P&T policy.  Thank you. Lashanda Storlie, Pharm.D., BCPS Clinical Pharmacist Pager [phone]    

## 2011-10-01 NOTE — Progress Notes (Signed)
Subjective: Patient reports Patient's feeling great minimal headache as any weakness or numbness in his arms or his legs to  Objective: Vital signs in last 24 hours: Temp:  [96.5 F (35.8 C)-98.9 F (37.2 C)] 98.8 F (37.1 C) (01/29 0400) Pulse Rate:  [62-88] 68  (01/29 0800) Resp:  [13-30] 17  (01/29 0800) BP: (101-172)/(52-87) 153/80 mmHg (01/29 0800) SpO2:  [93 %-100 %] 93 % (01/29 0800) Arterial Line BP: (121-180)/(39-74) 163/63 mmHg (01/29 0800) Weight:  [60.1 kg (132 lb 7.9 oz)-63.5 kg (139 lb 15.9 oz)] 63.5 kg (139 lb 15.9 oz) (01/29 0400)  Intake/Output from previous day: 01/28 0701 - 01/29 0700 In: 2875 [P.O.:200; I.V.:2525; IV Piggyback:150] Out: 2010 [Urine:1995; Blood:15] Intake/Output this shift: Total I/O In: 75 [I.V.:75] Out: -   Strength 5 out of 5 wound clean and dry mild saturation of the dressing awake alert and oriented  Lab Results: No results found for this basename: WBC:2,HGB:2,HCT:2,PLT:2 in the last 72 hours BMET No results found for this basename: NA:2,K:2,CL:2,CO2:2,GLUCOSE:2,BUN:2,CREATININE:2,CALCIUM:2 in the last 72 hours  Studies/Results: [name] Wo Contrast  09/30/2011  *RADIOLOGY REPORT*  Clinical Data: Lung cancer.  Preop stealth protocol for craniotomy.  MRI HEAD WITHOUT AND WITH CONTRAST  Technique:  Multiplanar, multiecho pulse sequences of the brain and surrounding structures were obtained according to standard protocol without and with intravenous contrast  Contrast:  6 ml Multihance.  Comparison: MRI brain 08/21/2011.  Findings: A right posterior frontal tumor is again noted.  The primary lesion appears slightly smaller than on the prior exam. The study is moderately degraded by patient motion.  The extent of FLAIR hyperintensity surrounding the tumor has diminished greatly.  The postcontrast images demonstrate a second punctate area of enhancement just anterior to the primary lesion.  Fluid is again noted within the mastoid air cells  bilaterally. Mucosal thickening is present in the maxillary sinuses with interval decrease in the size of a right maxillary fluid level. The patient is status post resection of the right lobe.  IMPRESSION:  1.  Interval decrease in size of the primary tumor in the posterior right frontal lobe. 2.  A second focal area of enhancement is suspected just anterior to the larger lesion, measuring 7 mm.  Both lesions are compatible with metastatic disease. 3.  Interval decrease in diffuse vasogenic edema.  Original Report Authenticated By: Jamesetta Orleans. MATTERN, M.D.    Assessment/Plan: Postoperative day 1 craniotomy for a right frontal mass doing very well we'll progress immobilize a physical therapy wean his Decadron.  LOS: 1 day     Ladarion Munyon P 10/01/2011, 9:04 AM

## 2011-10-01 NOTE — Progress Notes (Signed)
INITIAL ADULT NUTRITION ASSESSMENT Date: 10/01/2011   Time: 11:42 AM Reason for Assessment: At Risk Report  ASSESSMENT: Male 77 y.o.  Dx: Headaches left-sided weakness Patient is a 77 year old gentleman with history of adenocarcinoma to the lung diagnosed in August of 2011% with weakness in his left side.difficulty and headaches her cup has revealed a right posterior frontoparietal metastasis. This workup with his oncologist reveals the lung cancer to be relatively controlled and after extensive discussions with them and the patient and family we have elected to proceed forward with excision of the right frontal metastasis    Hx:  Past Medical History  Diagnosis Date  . CAD (coronary artery disease)     -s/p Cypher drug eluting stent to LCX for unstable angina in 2006  . Renal artery stenosis   . HTN (hypertension)   . Hypercholesterolemia   . Renal insufficiency     baseline Cr 1.5-1.8  . PAD (peripheral artery disease)   . 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 Torticollis   . Opacity, cornea, central     RUL opacity on CXR 9/10 - refused CT  . Adenocarcinoma     Right upper lobe   . Coordination impairment     Right Parietal Mass  . Weakness 08/20/11    Brain Mets - Left Leg and Arm Left pronator drift  . Brain cancer      Mets - Right Parietal mass - Vasogenic Edema    Related Meds:     . amLODipine  10 mg Oral Daily  . bacitracin      . carvedilol  25 mg Oral BID  . ceFAZolin      .  ceFAZolin (ANCEF) IV  1 g Intravenous Q8H  . Chlorhexidine Gluconate Cloth  6 each Topical Daily  . cloNIDine  0.3 mg Oral BID  . dexamethasone  4 mg Intravenous Q6H  . docusate sodium  100 mg Oral BID  . doxazosin  1 mg Oral QHS  . hydrochlorothiazide  25 mg Oral Daily  . mupirocin ointment  1 application Nasal BID  . pantoprazole (PROTONIX) IV  40 mg Intravenous QHS  . senna-docusate  1 tablet Oral BID  . simvastatin  40 mg Oral QHS  . sodium  chloride      . Tamsulosin HCl  0.4 mg Oral Daily  . DISCONTD: dexamethasone  10 mg Intravenous Q6H  . DISCONTD: dexamethasone  4 mg Oral BID WC     Ht: 5\' 10"  (177.8 cm)  Wt: 139 lb 15.9 oz (63.5 kg)  Ideal Wt: 75.4 kg % Ideal Wt: 84%  Usual Wt: 76.3 kg - 1 year ago % Usual Wt: 83%  Wt Readings from Last 3 Encounters:  10/01/11 139 lb 15.9 oz (63.5 kg)  10/01/11 139 lb 15.9 oz (63.5 kg)  09/24/11 135 lb 12.9 oz (61.6 kg)     Body mass index is 20.09 kg/(m^2).  Food/Nutrition Related Hx: Pt HOH. Pt reports that he lost a lot of weight prior to lung ca dx but denies recent wt loss. Pt denies problems chewing or swallowing. Pt states that he drinks ensure because he knows he needs to but does not like it. Pt willing to try [employer]. Trial of [employer] given, pt drank entire container stating that it was really good, will order.  Labs:  CMP     Component Value Date/Time   NA 139 09/24/2011 1306  K 4.3 09/24/2011 1306   CL 105 09/24/2011 1306   CO2 23 09/24/2011 1306   GLUCOSE 141* 09/24/2011 1306   BUN 78* 09/24/2011 1306   CREATININE 1.69* 09/24/2011 1306   CALCIUM 9.8 09/24/2011 1306   PROT 5.2* 09/05/2011 1118   ALBUMIN 3.4* 09/05/2011 1118   AST 12 09/05/2011 1118   ALT 16 09/05/2011 1118   ALKPHOS 37* 09/05/2011 1118   BILITOT 0.3 09/05/2011 1118   GFRNONAA 38* 09/24/2011 1306   GFRAA 44* 09/24/2011 1306   CBG (last 3)  No results found for this basename: GLUCAP:3 in the last 72 hours   Intake/Output Summary (Last 24 hours) at 10/01/11 1145 Last data filed at 10/01/11 1100  Gross per 24 hour  Intake   3175 ml  Output   2180 ml  Net    995 ml   Pt POD #1 s/p Stealth stereotactic craniotomy for removal of right posterior frontoparietal brain tumor  Diet Order: Regular  Supplements/Tube Feeding: none  IVF:    sodium chloride Last Rate: 75 mL/hr at 10/01/11 1100    Estimated Nutritional Needs:   Kcal: 1750-1950 Protein: 85-100 grams Fluid:  >1.8L/day  NUTRITION DIAGNOSIS: -Increased nutrient needs (Protein) (NI-5.1).  Status: Ongoing  RELATED TO: recent surgery  AS EVIDENCE BY: minimum estimated protein needs of 1.3 grams/kg of body weight  MONITORING/EVALUATION(Goals): Goal: Pt will consume >/=90% of pt's estimated needs Monitor: po intake, weight, labs  EDUCATION NEEDS: -No education needs identified at this time  INTERVENTION:  [employer] TID  Dietitian [phone]  DOCUMENTATION CODES Per approved criteria  -Not Applicable    [name] 10/01/2011, 11:42 AM

## 2011-10-01 NOTE — Evaluation (Signed)
Physical Therapy Evaluation Patient Details Name: [name] MRN: [id number] DOB: [birth year] Today's Date: 10/01/2011  Problem List:  Patient Active Problem List  Diagnoses  . HYPERLIPIDEMIA TYPE IIB / III  . HYPERLIPIDEMIA-MIXED  . TOBACCO ABUSE  . HYPERTENSION, BENIGN  . CAD, NATIVE VESSEL  . ABDOMINAL AORTIC ANEURYSM  . Nonspecific (abnormal) findings on radiological and other examination of body structure  . Nonspecific (abnormal) findings on radiological and other examination of skull and head  . Invasive denocarcinoma pT2a N0 Mx, Right Upper lobe  . Brain metastasis    Past Medical History:  Past Medical History  Diagnosis Date  . CAD (coronary artery disease)     -s/p Cypher drug eluting stent to LCX for unstable angina in 2006  . Renal artery stenosis   . HTN (hypertension)   . Hypercholesterolemia   . Renal insufficiency     baseline Cr 1.5-1.8  . PAD (peripheral artery disease)   . 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 Torticollis   . Opacity, cornea, central     RUL opacity on CXR 9/10 - refused CT  . Adenocarcinoma     Right upper lobe   . Coordination impairment     Right Parietal Mass  . Weakness 08/20/11    Brain Mets - Left Leg and Arm Left pronator drift  . Brain cancer      Mets - Right Parietal mass - Vasogenic Edema   Past Surgical History:  Past Surgical History  Procedure Date  . Coronary stent placement   . Bronchoscopy 04/10/2010    [facility]  . Rt vats, rt upper lobectomy, mediastinal lymph node dissection 04/10/2010    [facility]  . Left kidney stent placement   . Hemorroidectomy     PT Assessment/Plan/Recommendation PT Assessment Clinical Impression Statement: Co-evaluation with OT. Pt s/p Rt frontal craniotomy due to brain metastasis from lung cancer.  Pt very motivated to improve mobility and independence (currently limited by problems below).  Will benefit from PT to maximize independence  prior to d/c home with family's assist. PT Recommendation/Assessment: Patient will need skilled PT in the acute care venue PT Problem List: Decreased activity tolerance;Decreased balance;Decreased mobility;Decreased knowledge of use of DME;Cardiopulmonary status limiting activity Barriers to Discharge: None Barriers to Discharge Comments: per pt will be able to arrange necessary assist at home amongst family (sisters and children) PT Therapy Diagnosis : Difficulty walking PT Plan PT Frequency: Min 3X/week PT Treatment/Interventions: DME instruction;Gait training;Stair training;Functional mobility training;Therapeutic activities;Balance training;Patient/family education PT Recommendation Follow Up Recommendations: Home health PT;Supervision/Assistance - 24 hour Equipment Recommended:  (PT needs TBA) PT Goals  Acute Rehab PT Goals PT Goal Formulation: With patient Time For Goal Achievement: 7 days Pt will Roll Supine to Left Side: with modified independence PT Goal: Rolling Supine to Left Side - Progress: Goal set today Pt will go Supine/Side to Sit: with modified independence;with HOB 0 degrees PT Goal: Supine/Side to Sit - Progress: Goal set today Pt will go Sit to Supine/Side: with modified independence;with HOB 0 degrees PT Goal: Sit to Supine/Side - Progress: Goal set today Pt will go Sit to Stand: with supervision;with upper extremity assist PT Goal: Sit to Stand - Progress: Goal set today Pt will go Stand to Sit: with supervision;without upper extremity assist PT Goal: Stand to Sit - Progress: Goal set today Pt will Ambulate: 51 - 150 feet;with supervision;with least restrictive assistive device PT Goal: Ambulate - Progress:  Goal set today Pt will Go Up / Down Stairs: 3-5 stairs;with least restrictive assistive device;with min assist PT Goal: Up/Down Stairs - Progress: Goal set today Pt will Perform Home Exercise Program: with supervision, verbal cues required/provided (for  balance) PT Goal: Perform Home Exercise Program - Progress: Goal set today  PT Evaluation Precautions/Restrictions  Precautions Precautions: Fall Restrictions Other Position/Activity Restrictions: pt with h/o torticollis x 14 yrs and reports he is due for his botox injection (he gets them every 4 mos); head in flexion and rotated to Lt Prior Functioning  Home Living Lives With: Alone Receives Help From: Family Type of Home: House Home Layout: One level Home Access: Stairs to enter Secretary/administrator of Steps: 3 Bathroom Shower/Tub: Health visitor: Standard Bathroom Accessibility: Yes How Accessible: Accessible via walker Home Adaptive Equipment: Grab bars in shower;Shower chair with back;Quad cane (has access to other DME through his church and family) Additional Comments: Pt. reports 2 children and 3 sisters help him as needed and someone has been with him almost 24 hours/day Prior Function Level of Independence: Independent with basic ADLs;Independent with gait;Independent with transfers;Needs assistance with homemaking Able to Take Stairs?: Yes Driving: Yes Vocation: Retired Financial risk analyst Arousal/Alertness: Awake/alert Overall Cognitive Status: Appears within functional limits for tasks assessed Orientation Level: Oriented X4 Sensation/Coordination Sensation Light Touch:  (pt denies numbness in legs/feet) Coordination Gross Motor Movements are Fluid and Coordinated: Yes Fine Motor Movements are Fluid and Coordinated: Yes Extremity Assessment RUE Assessment RUE Assessment: Within Functional Limits LUE Assessment LUE Assessment: Within Functional Limits RLE Assessment RLE Assessment: Within Functional Limits LLE Assessment LLE Assessment: Within Functional Limits Mobility (including Balance) Bed Mobility Bed Mobility: Yes Rolling Left: 5: Supervision Rolling Left Details (indicate cue type and reason): for safety due to multiple  lines/tubes/monitors Left Sidelying to Sit: 4: Min assist;HOB elevated (comment degrees) Left Sidelying to Sit Details (indicate cue type and reason): HOB 30; minguard assist for safety (lines, ICU bed) Supine to Sit: 4: Min assist;HOB elevated (Comment degrees) Supine to Sit Details (indicate cue type and reason): HOB 30; pt became fatigued sitting EOB while BP monitored and abruptly returned to supine, then came to sit EOB a second time Sitting - Scoot to [place] of Bed: 5: Supervision Sitting - Scoot to [place] of Bed Details (indicate cue type and reason): safety with lines Sit to Supine: 4: Min assist;HOB elevated (comment degrees) Sit to Supine - Details (indicate cue type and reason): HOB 30; assist for legs Transfers Sit to Stand: 4: Min assist;With upper extremity assist;From bed Sit to Stand Details (indicate cue type and reason): with +1 for multiple lines/tubes/monitor; pt uses wide BOS Stand to Sit: 4: Min assist;With upper extremity assist;With armrests;To chair/3-in-1 Stand to Sit Details: with +1 for multiple lines/tubes/monitor Ambulation/Gait Ambulation/Gait: Yes Ambulation/Gait Assistance: 4: Min assist Ambulation/Gait Assistance Details (indicate cue type and reason): +1 for lines/tubes; pt with wide stance; distance limited due to fatigue and variable BPs (high and then low) Ambulation Distance (Feet): 3 Feet Assistive device: 1 person hand held assist Gait Pattern: Step-to pattern (side-stepping to chair)  Posture/Postural Control Posture/Postural Control: Postural limitations Postural Limitations: torticollis  Balance Balance Assessed: Yes Static Sitting Balance Static Sitting - Balance Support: Left upper extremity supported;Feet unsupported (EOB) Static Sitting - Level of Assistance: 5: Stand by assistance Static Sitting - Comment/# of Minutes: EOB ~5 minutes assessing BP and dizziness Static Standing Balance Static Standing - Balance Support: Left upper extremity  supported;During functional activity Static Standing -  Level of Assistance: 4: Min assist Dynamic Standing Balance Dynamic Standing - Balance Support: Left upper extremity supported Dynamic Standing - Level of Assistance: 4: Min assist Dynamic Standing - Balance Activities: Other (comment) (marching in place) Exercise    End of Session PT - End of Session Equipment Utilized During Treatment: Gait belt Activity Tolerance: Patient limited by fatigue;Treatment limited secondary to medical complications (Comment) (SBP to be kept <160 and was close to this; orthostasis) Patient left: in chair;with call bell in reach Nurse Communication: Mobility status for transfers General Behavior During Session: [facility] for tasks performed Cognition: [facility] for tasks performed  Orson Rho 10/01/2011, 11:35 AM  Pager [phone]

## 2011-10-01 NOTE — Progress Notes (Signed)
UR COMPLETED  

## 2011-10-01 NOTE — Evaluation (Signed)
Occupational Therapy Evaluation Patient Details Name: [name] MRN: [id number] DOB: Jan 02, 1935 Today's Date: 10/01/2011  Problem List:  Patient Active Problem List  Diagnoses  . HYPERLIPIDEMIA TYPE IIB / III  . HYPERLIPIDEMIA-MIXED  . TOBACCO ABUSE  . HYPERTENSION, BENIGN  . CAD, NATIVE VESSEL  . ABDOMINAL AORTIC ANEURYSM  . Nonspecific (abnormal) findings on radiological and other examination of body structure  . Nonspecific (abnormal) findings on radiological and other examination of skull and head  . Invasive denocarcinoma pT2a N0 Mx, Right Upper lobe  . Brain metastasis    Past Medical History:  Past Medical History  Diagnosis Date  . CAD (coronary artery disease)     -s/p Cypher drug eluting stent to LCX for unstable angina in 2006  . Renal artery stenosis   . HTN (hypertension)   . Hypercholesterolemia   . Renal insufficiency     baseline Cr 1.5-1.8  . PAD (peripheral artery disease)   . 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 Torticollis   . Opacity, cornea, central     RUL opacity on CXR 9/10 - refused CT  . Adenocarcinoma     Right upper lobe   . Coordination impairment     Right Parietal Mass  . Weakness 08/20/11    Brain Mets - Left Leg and Arm Left pronator drift  . Brain cancer      Mets - Right Parietal mass - Vasogenic Edema   Past Surgical History:  Past Surgical History  Procedure Date  . Coronary stent placement   . Bronchoscopy 04/10/2010    [facility]  . Rt vats, rt upper lobectomy, mediastinal lymph node dissection 04/10/2010    [facility]  . Left kidney stent placement   . Hemorroidectomy     OT Assessment/Plan/Recommendation OT Assessment Clinical Impression Statement: This 77 y.o. male was admitted with Frontal lobe metastases and underwent craniectomy with resection of tumor.  Pt. presents to OT with the below listed deficits, and woul benefit from OT to maximize independence with BADLs to  allow pt. to return home at min A - supervision level.  Anticipate good progress and that pt. will be able to return home with [facility] therapies, and 24 hour supervision initially OT Recommendation/Assessment: Patient will need skilled OT in the acute care venue OT Problem List: Decreased strength;Decreased activity tolerance;Impaired balance (sitting and/or standing);Decreased knowledge of use of DME or AE Barriers to Discharge: None OT Therapy Diagnosis : Generalized weakness OT Plan OT Frequency: Min 2X/week OT Treatment/Interventions: Self-care/ADL training;DME and/or AE instruction;Therapeutic activities;Patient/family education;Balance training OT Recommendation Follow Up Recommendations: Home health OT;Supervision/Assistance - 24 hour (24 hour supervision initially) Equipment Recommended: 3 in 1 bedside comode (if pt. does not have access to this from family or church) Individuals Consulted Consulted and Agree with Results and Recommendations: Patient OT Goals Acute Rehab OT Goals OT Goal Formulation: With patient Time For Goal Achievement: 2 weeks ADL Goals Pt Will Perform Grooming: with supervision;Standing at sink ADL Goal: Grooming - Progress: Goal set today Pt Will Perform Upper Body Bathing: with set-up;Unsupported;Sitting, chair ADL Goal: Upper Body Bathing - Progress: Goal set today Pt Will Perform Lower Body Bathing: with supervision;Sit to stand from chair;Sit to stand from bed ADL Goal: Lower Body Bathing - Progress: Goal set today Pt Will Perform Upper Body Dressing: with set-up;Unsupported;Sitting, chair ADL Goal: Upper Body Dressing - Progress: Goal set today Pt Will Perform Lower Body Dressing: with supervision;Sit to stand  from chair;Sit to stand from bed ADL Goal: Lower Body Dressing - Progress: Goal set today Pt Will Transfer to Toilet: with supervision;Ambulation;Comfort height toilet ADL Goal: Toilet Transfer - Progress: Goal set today Pt Will Perform Toileting -  Clothing Manipulation: with supervision;Standing ADL Goal: Toileting - Clothing Manipulation - Progress: Goal set today Pt Will Perform Tub/Shower Transfer: Shower transfer;with supervision;Ambulation;Shower seat without back ADL Goal: Web designer - Progress: Goal set today  OT Evaluation Precautions/Restrictions  Precautions Precautions: Fall Restrictions Other Position/Activity Restrictions: pt with h/o torticollis x 14 yrs and reports he is due for his botox injection (he gets them every 4 mos); head in flexion and rotated to Lt Prior Functioning Home Living Lives With: Alone Receives Help From: Family Type of Home: House Home Layout: One level Home Access: Stairs to enter Secretary/administrator of Steps: 3 Bathroom Shower/Tub: Health visitor: Standard Bathroom Accessibility: Yes How Accessible: Accessible via walker Home Adaptive Equipment: Grab bars in shower;Shower chair with back;Quad cane (has access to other DME through his church and family) Additional Comments: Pt. reports 2 children and 3 sisters help him as needed and someone has been with him almost 24 hours/day Prior Function Level of Independence: Independent with basic ADLs;Independent with gait;Independent with transfers;Needs assistance with homemaking Able to Take Stairs?: Yes Driving: Yes Vocation: Retired ADL ADL Eating/Feeding: Simulated;Independent Where Assessed - Eating/Feeding: Chair Grooming: Simulated;Wash/dry face;Wash/dry hands;Teeth care;Set up Where Assessed - Grooming: Sitting, chair;Supported Upper Body Bathing: Simulated;Minimal assistance Where Assessed - Upper Body Bathing: Sitting, chair;Supported Lower Body Bathing: Simulated;Moderate assistance Where Assessed - Lower Body Bathing: Sit to stand from bed Upper Body Dressing: Simulated;Moderate assistance Where Assessed - Upper Body Dressing: Unsupported;Sitting, chair Lower Body Dressing: Simulated;Moderate  assistance Where Assessed - Lower Body Dressing: Sit to stand from chair;Sit to stand from bed Toilet Transfer: Simulated;Minimal assistance;Other (comment) (and +1 for lines, and pt. safety) Toilet Transfer Method: Stand pivot Toileting - Clothing Manipulation: Simulated;Minimal assistance Where Assessed - Toileting Clothing Manipulation: Standing Toileting - Hygiene: Simulated;Minimal assistance Where Assessed - Toileting Hygiene: Standing Ambulation Related to ADLs: Ambulation not performed due to pt. fatigue ADL Comments: Pt. is very motivated. Pt. sits EOB wtih supervision.  Pt. with torticollis - he reports he gets Botox injections every 4 months and was unable to get the most recent scheduled injection Vision/Perception  Vision - History Baseline Vision: Wears glasses all the time Visual History: Other (comment) (Rt. eye enucleation) Patient Visual Report: No change from baseline Vision - Assessment Vision Assessment: Vision tested Ocular Range of Motion: Within Functional Limits Tracking/Visual Pursuits: Able to track stimulus in all quads without difficulty Visual Fields: No apparent deficits Perception Perception: Within Functional Limits Praxis Praxis: Intact Cognition Cognition Arousal/Alertness: Awake/alert Overall Cognitive Status: Appears within functional limits for tasks assessed Orientation Level: Oriented X4 Sensation/Coordination Sensation Light Touch:  (pt denies numbness in legs/feet) Coordination Gross Motor Movements are Fluid and Coordinated: Yes Fine Motor Movements are Fluid and Coordinated: Yes Extremity Assessment RUE Assessment RUE Assessment: Within Functional Limits LUE Assessment LUE Assessment: Within Functional Limits Mobility  Bed Mobility Bed Mobility: Yes Rolling Left: 5: Supervision Rolling Left Details (indicate cue type and reason): for safety due to multiple lines/tubes/monitors Left Sidelying to Sit: 4: Min assist;HOB elevated  (comment degrees) Left Sidelying to Sit Details (indicate cue type and reason): HOB 30; minguard assist for safety (lines, ICU bed) Supine to Sit: 4: Min assist;HOB elevated (Comment degrees) Supine to Sit Details (indicate cue type and reason): HOB 30; pt  became fatigued sitting EOB while BP monitored and abruptly returned to supine, then came to sit EOB a second time Sitting - Scoot to [place] of Bed: 5: Supervision Sitting - Scoot to Edge of Bed Details (indicate cue type and reason): safety with lines Sit to Supine: 4: Min assist;HOB elevated (comment degrees) Sit to Supine - Details (indicate cue type and reason): HOB 30; assist for legs Transfers Transfers: Yes Sit to Stand: 4: Min assist;With upper extremity assist;From bed Sit to Stand Details (indicate cue type and reason): with +1 for multiple lines/tubes/monitor; pt uses wide BOS Stand to Sit: 4: Min assist;With upper extremity assist;With armrests;To chair/3-in-1 Stand to Sit Details: with +1 for multiple lines/tubes/monitor Exercises   End of Session OT - End of Session Equipment Utilized During Treatment: Gait belt Activity Tolerance: Patient limited by fatigue Patient left: in chair;with call bell in reach Nurse Communication: Mobility status for transfers General Behavior During Session: [facility] for tasks performed Cognition: [facility] for tasks performed   Lakita Sahlin M 10/01/2011, 11:30 AM

## 2011-10-02 ENCOUNTER — Ambulatory Visit (HOSPITAL_COMMUNITY): Admission: RE | Admit: 2011-10-02 | Payer: Medicare Other | Source: Ambulatory Visit | Admitting: Neurosurgery

## 2011-10-02 ENCOUNTER — Encounter (HOSPITAL_COMMUNITY): Admission: RE | Payer: Self-pay | Source: Ambulatory Visit

## 2011-10-02 SURGERY — CRANIOTOMY TUMOR EXCISION
Anesthesia: General

## 2011-10-02 MED ORDER — DEXAMETHASONE 2 MG PO TABS
2.0000 mg | ORAL_TABLET | Freq: Two times a day (BID) | ORAL | Status: DC
  Administered 2011-10-02 (×2): 2 mg via ORAL
  Filled 2011-10-02 (×4): qty 1

## 2011-10-02 NOTE — Progress Notes (Signed)
Subjective: Patient reports Feel much better this morning for his legs feel weak on the steroids  Objective: Vital signs in last 24 hours: Temp:  [97.5 F (36.4 C)-98.8 F (37.1 C)] 98.4 F (36.9 C) (01/30 0445) Pulse Rate:  [64-103] 64  (01/30 0445) Resp:  [14-31] 17  (01/30 0445) BP: (96-160)/(49-95) 156/85 mmHg (01/30 0445) SpO2:  [90 %-96 %] 95 % (01/30 0445)  Intake/Output from previous day: 01/29 0701 - 01/30 0700 In: 1925 [P.O.:200; I.V.:1575; IV Piggyback:150] Out: 1020 [Urine:1020] Intake/Output this shift:    Awake alert oriented strength 5 out of 5 wound clean and dry  Lab Results: No results found for this basename: WBC:2,HGB:2,HCT:2,PLT:2 in the last 72 hours BMET No results found for this basename: NA:2,K:2,CL:2,CO2:2,GLUCOSE:2,BUN:2,CREATININE:2,CALCIUM:2 in the last 72 hours  Studies/Results: [name] Wo Contrast  09/30/2011  *RADIOLOGY REPORT*  Clinical Data: Lung cancer.  Preop stealth protocol for craniotomy.  MRI HEAD WITHOUT AND WITH CONTRAST  Technique:  Multiplanar, multiecho pulse sequences of the brain and surrounding structures were obtained according to standard protocol without and with intravenous contrast  Contrast:  6 ml Multihance.  Comparison: MRI brain 08/21/2011.  Findings: A right posterior frontal tumor is again noted.  The primary lesion appears slightly smaller than on the prior exam. The study is moderately degraded by patient motion.  The extent of FLAIR hyperintensity surrounding the tumor has diminished greatly.  The postcontrast images demonstrate a second punctate area of enhancement just anterior to the primary lesion.  Fluid is again noted within the mastoid air cells bilaterally. Mucosal thickening is present in the maxillary sinuses with interval decrease in the size of a right maxillary fluid level. The patient is status post resection of the right lobe.  IMPRESSION:  1.  Interval decrease in size of the primary tumor in the posterior  right frontal lobe. 2.  A second focal area of enhancement is suspected just anterior to the larger lesion, measuring 7 mm.  Both lesions are compatible with metastatic disease. 3.  Interval decrease in diffuse vasogenic edema.  Original Report Authenticated By: Jamesetta Orleans. MATTERN, M.D.    Assessment/Plan: Progressive mobilization it with physical therapy wean his Decadron plan on possible discharge tomorrow  LOS: 2 days     Phineas Mcenroe P 10/02/2011, 8:35 AM

## 2011-10-02 NOTE — Clinical Documentation Improvement (Signed)
GENERIC DOCUMENTATION CLARIFICATION QUERY  THIS DOCUMENT IS NOT A PERMANENT PART OF THE MEDICAL RECORD  TO RESPOND TO THE THIS QUERY, FOLLOW THE INSTRUCTIONS BELOW:  1. If needed, update documentation for the patient's encounter via the notes activity.  2. Access this query again and click edit on the In [employer].  3. After updating, or not, click F2 to complete all highlighted (required) fields concerning your review. Select "additional documentation in the medical record" OR "no additional documentation provided".  4. Click Sign note button.  5. The deficiency will fall out of your In Basket *Please let us know if you are not able to complete this workflow by phone or e-mail (listed below).  Please update your documentation within the medical record to reflect your response to this query.                                                                                        10/02/11   Dear Dr.Cram / Associates,  In a better effort to capture your patient's severity of illness, reflect appropriate length of stay and utilization of resources, a review of the patient medical record has revealed the following indicators.   Based on your clinical judgment, please clarify and document in a progress note and/or discharge summary the clinical condition associated with the following supporting information: In responding to this query please exercise your independent judgment.  The fact that a query is asked, does not imply that any particular answer is desired or expected.  Please consider the below (if your clinical findings/judgment agree) as you document the patient's diagnosis/condition(s) in the progress note and discharge summary. Thank you!  ("resolved", "resolving" - may be added when appropriate)  Possible Clinical Conditions?  - Vasogenic Edema  - Cerebral Edema  - Other condition (please document in the progress notes and/or discharge summary)  - Cannot Clinically  determine at this time   Supporting Information:  - MR Brain 1/28 = 1. Interval decrease in size of the primary tumor in the posterior right frontal lobe. 2. A second focal area of enhancement is suspected just anterior to the larger lesion, measuring 7 mm. Both lesions are compatible with metastatic disease. 3. Interval decrease in diffuse vasogenic edema.  - Admitted with Right posterior frontal parietal brain metastasis and for Stealth stereotactic craniotomy for removal of right posterior frontoparietal brain tumor   - Medication - dexamethasone (DECADRON)  ** You may use possible, probable, or suspect with inpatient documentation. possible, probable, suspected diagnoses MUST be documented at the time of discharge   No additional documentation in chart upon review. SM   Thank You,  Saul Fordyce  Clinical Documentation Specialist: [phone] Pager  Health Information Management [place]

## 2011-10-02 NOTE — Progress Notes (Signed)
Pt is a 1 ppd smoker who says that he wants to quit and Dr Larina Bras has given him "stuff" to help him with quitting. He doesn't know what the stuff is but says his doctor is helping him with quitting. Referred to 1-800 quit now for f/u and support. Discussed oral fixation substitutes, second hand smoke and in home smoking policy. Reviewed and gave pt Written education/contact information.

## 2011-10-02 NOTE — Progress Notes (Signed)
Physical Therapy Treatment Patient Details Name: [name] MRN: [id number] DOB: September 07, 1934 Today's Date: 10/02/2011  PT Assessment/Plan  PT - Assessment/Plan Comments on Treatment Session: Pt s/p Rt frontal craniotomy due to brain metastasis from lung cancer. Pt was much more stable with gait with assist from RW. Pt continues to demonstrate some instabilities however and decreased safety awareness therefore recommend 24 hour supervision initially.  PT Frequency: Min 3X/week Follow Up Recommendations: Home health PT;Supervision/Assistance - 24 hour Equipment Recommended:  (Pt reports he has RW) PT Goals  Acute Rehab PT Goals PT Goal Formulation: With patient Time For Goal Achievement: 7 days Pt will Roll Supine to Left Side: with modified independence PT Goal: Rolling Supine to Left Side - Progress: Progressing toward goal Pt will go Supine/Side to Sit: with modified independence;with HOB 0 degrees PT Goal: Supine/Side to Sit - Progress: Progressing toward goal Pt will go Sit to Supine/Side: with modified independence;with HOB 0 degrees PT Goal: Sit to Supine/Side - Progress: Progressing toward goal Pt will go Sit to Stand: with supervision;with upper extremity assist PT Goal: Sit to Stand - Progress: Progressing toward goal Pt will go Stand to Sit: with supervision;without upper extremity assist PT Goal: Stand to Sit - Progress: Progressing toward goal Pt will Ambulate: 51 - 150 feet;with supervision;with least restrictive assistive device PT Goal: Ambulate - Progress: Progressing toward goal Pt will Go Up / Down Stairs: 3-5 stairs;with least restrictive assistive device;with min assist PT Goal: Up/Down Stairs - Progress: Progressing toward goal Pt will Perform Home Exercise Program: with supervision, verbal cues required/provided (for balance) PT Goal: Perform Home Exercise Program - Progress: Progressing toward goal  PT Treatment Precautions/Restrictions   Precautions Precautions: Fall Restrictions Other Position/Activity Restrictions: pt with h/o torticollis x 14 yrs and reports he is due for his botox injection (he gets them every 4 mos); head in flexion and rotated to Lt Mobility (including Balance) Bed Mobility Rolling Right: 6: Modified independent (Device/Increase time) Right Sidelying to Sit: 5: Supervision Right Sidelying to Sit Details (indicate cue type and reason): Pt requesting assist - after PT explained he needed to try on his own pt performed with ease. Sit to Supine: 4: Min assist;HOB elevated (comment degrees) (HOB30) Sit to Supine - Details (indicate cue type and reason): Assist for bil. LEs pt reporting "They're weak" Transfers Transfers: Yes Sit to Stand: 4: Min assist;With upper extremity assist;From bed;From toilet Sit to Stand Details (indicate cue type and reason): Light min assist, in standing pt reaching for objects for balance.  Stand to Sit: With upper extremity assist;Other (comment);To bed;To toilet (Min-guard assist) Stand to Sit Details: Verbal cues for safety and UE placement.  Ambulation/Gait Ambulation/Gait: Yes Ambulation/Gait Assistance: 4: Min assist;Other (comment) Ambulation/Gait Assistance Details (indicate cue type and reason): Min assist initially without assistive device, pt reaching for objects around room for stability. With RW pt min-guard assist with no losses of balance. Pt requires verbal cues to utilize both hands on RW at all times - tends to lift one with gait at times.  Ambulation Distance (Feet): 75 Feet Assistive device: 1 person hand held assist;Rolling walker Gait Pattern: Step-to pattern;Trunk flexed;Decreased stride length (Decreased foot clearance bilaterally.) Stairs: No    Exercise  Other Exercises Other Exercises: Pt declining - pt reporting he is fatigued.  End of Session PT - End of Session Equipment Utilized During Treatment: Gait belt Activity Tolerance: Patient  limited by fatigue Patient left: with call bell in reach;in bed Nurse Communication: Mobility status for  ambulation General Behavior During Session: [facility] for tasks performed Cognition: Impaired Cognitive Impairment: Difficult to assess secondary to pt HOH, pt with some decreased safety awareness such as not keeping both hands on RW with gait. Recommend supervision with all mobility.   Sherie Don 10/02/2011, 6:41 PM  Dahlia Client ([place]vere[place] Pace) Carleene Mains PT, DPT Acute Rehabilitation [phone]

## 2011-10-03 ENCOUNTER — Telehealth: Payer: Self-pay | Admitting: Thoracic Surgery (Cardiothoracic Vascular Surgery)

## 2011-10-03 MED ORDER — PANTOPRAZOLE SODIUM 40 MG PO TBEC: 40.0000 mg | DELAYED_RELEASE_TABLET | Freq: Every day | ORAL | Status: DC

## 2011-10-03 MED ORDER — DEXAMETHASONE 2 MG PO TABS: 2.0000 mg | ORAL_TABLET | Freq: Two times a day (BID) | ORAL | Status: AC

## 2011-10-03 MED ORDER — ACETAMINOPHEN-CODEINE #3 300-30 MG PO TABS: 1.0000 | ORAL_TABLET | ORAL | Status: AC | PRN

## 2011-10-03 NOTE — Discharge Summary (Signed)
Physician Discharge Summary  Patient ID: [name] MRN: [id number] DOB/AGE: 1934/10/14 77 y.o.  Admit date: 09/30/2011 Discharge date: 10/03/2011  Admission Diagnoses: Right parietal ring tumor  Discharge Diagnoses: Right parietal adenocarcinoma met Active Problems:  * No active hospital problems. *    Discharged Condition: good  Hospital Course: Patient was admitted as an early morning admission went to the operating room underwent Stealth stereotactic craniotomy for evacuation of a right parietal met postoperatively patient did very well went to recovery room in the ICU. Less well in the ICU neurologically stable with stable vital signs was able transferred to the floor possibly 1. The patient and the floor continued to do well with physical therapy and was able be discharged home scheduled followup in approximately one week she'll be discharged on Decadron as well as Tylenol No. 3.  Consults: Significant Diagnostic Studies: Treatments: Right parietal stereotactic craniotomy Discharge Exam: Blood pressure 159/84, pulse 82, temperature 98.2 F (36.8 C), temperature source Oral, resp. rate 18, height 5\' 10"  (1.778 m), weight 63.5 kg (139 lb 15.9 oz), SpO2 93.00%. Awake alert oriented strength 5 out of 5 wound clean and dry  Disposition: Home  Discharge Orders    Future Appointments: Provider: Department: Dept Phone: Center:   12/16/2011 12:00 PM [place] [facility] [phone] None   12/16/2011 1:00 PM [facility] [phone] [place]   12/23/2011 9:45 AM Mohamed K. Arbutus Ped, MD [facility] [phone] None     Medication List  As of 10/03/2011  8:23 AM   TAKE these medications         acetaminophen-codeine 300-30 MG per tablet   Commonly known as: TYLENOL #3   Take 1-2 tablets by mouth every 4 (four) hours as needed.      amLODipine 10 MG tablet   Commonly known as: NORVASC   Take 10 mg by mouth daily.      ASPIR-81 81 MG EC tablet   Generic drug: aspirin   Take 81 mg by mouth daily.      carvedilol 25 MG tablet   Commonly known as: COREG   Take 25 mg by mouth 2 (two) times daily.      cloNIDine 0.3 MG tablet   Commonly known as: CATAPRES   Take 0.3 mg by mouth 2 (two) times daily.      dexamethasone 4 MG tablet   Commonly known as: DECADRON   Take 4 mg by mouth 2 (two) times daily with a meal.      dexamethasone 2 MG tablet   Commonly known as: DECADRON   Take 1 tablet (2 mg total) by mouth every 12 (twelve) hours.      docusate sodium 100 MG capsule   Commonly known as: COLACE   Take 100 mg by mouth 2 (two) times daily.      doxazosin 1 MG tablet   Commonly known as: CARDURA   Take 1 mg by mouth at bedtime.      FLOMAX 0.4 MG Caps   Generic drug: Tamsulosin HCl   Take 0.4 mg by mouth daily.      hydrochlorothiazide 25 MG tablet   Commonly known as: HYDRODIURIL   Take 25 mg by mouth daily.      LORazepam 0.5 MG tablet   Commonly known as: ATIVAN   Take 0.5 mg by mouth at bedtime as needed.      pantoprazole 40 MG tablet   Commonly known as: PROTONIX   Take 1  tablet (40 mg total) by mouth daily at 12 noon.      PLAVIX 75 MG tablet   Generic drug: clopidogrel   Take 75 mg by mouth daily.      simvastatin 40 MG tablet   Commonly known as: ZOCOR   Take 40 mg by mouth at bedtime.      TEKTURNA 300 MG tablet   Generic drug: aliskiren   Take 300 mg by mouth daily.             Signed: Jaydalyn Demattia P 10/03/2011, 8:23 AM

## 2011-10-03 NOTE — Progress Notes (Signed)
Subjective: Patient reports This to a significant headache lysing well radial  Objective: Vital signs in last 24 hours: Temp:  [97.7 F (36.5 C)-98.7 F (37.1 C)] 98.2 F (36.8 C) (01/31 0600) Pulse Rate:  [78-94] 82  (01/31 0600) Resp:  [17-18] 18  (01/31 0600) BP: (137-175)/(76-90) 159/84 mmHg (01/31 0600) SpO2:  [93 %-97 %] 93 % (01/31 0600)  Intake/Output from previous day: 01/30 0701 - 01/31 0700 In: 2450 [P.O.:480] Out: 1 [Stool:1] Intake/Output this shift:    awake alert oriented strength 5 out of 5 wound clean and dry  Lab Results: No results found for this basename: WBC:2,HGB:2,HCT:2,PLT:2 in the last 72 hours BMET No results found for this basename: NA:2,K:2,CL:2,CO2:2,GLUCOSE:2,BUN:2,CREATININE:2,CALCIUM:2 in the last 72 hours  Studies/Results: No results found.  Assessment/Plan: Postop day 3 craniotomy for metastatic lesion discharged her scheduled followup in a week.  LOS: 3 days     Jazmyn Offner P 10/03/2011, 8:19 AM

## 2011-10-03 NOTE — Progress Notes (Signed)
Discharge teaching done. Pt stable and at baseline. Discharged home with children at 10:20 am,.

## 2011-10-03 NOTE — Progress Notes (Signed)
Patient was complaining of difficulty emptying his bladder and pain on his lower abdomen. Patient was able to void about 100 ml. Post void residual checked and it was more than 999 ml. Called Dr. Franky Macho (MD on call for Dr. Wynetta Emery) and ordered to insert foley. At 2000, inserted foley using sterile technique; patient able to tolerate the procedure. Will continue to monitor.

## 2011-10-04 ENCOUNTER — Emergency Department (HOSPITAL_COMMUNITY)
Admission: EM | Admit: 2011-10-04 | Discharge: 2011-10-04 | Disposition: A | Discharge status: Home / Self Care | Payer: Medicare Other | Attending: Emergency Medicine | Admitting: Emergency Medicine

## 2011-10-04 ENCOUNTER — Encounter (HOSPITAL_COMMUNITY): Payer: Self-pay | Admitting: Emergency Medicine

## 2011-10-04 DIAGNOSIS — R339 Retention of urine, unspecified: Secondary | ICD-10-CM | POA: Insufficient documentation

## 2011-10-04 DIAGNOSIS — E119 Type 2 diabetes mellitus without complications: Secondary | ICD-10-CM | POA: Insufficient documentation

## 2011-10-04 DIAGNOSIS — I10 Essential (primary) hypertension: Secondary | ICD-10-CM | POA: Insufficient documentation

## 2011-10-04 DIAGNOSIS — N289 Disorder of kidney and ureter, unspecified: Secondary | ICD-10-CM | POA: Insufficient documentation

## 2011-10-04 DIAGNOSIS — I2510 Atherosclerotic heart disease of native coronary artery without angina pectoris: Secondary | ICD-10-CM | POA: Insufficient documentation

## 2011-10-04 DIAGNOSIS — I739 Peripheral vascular disease, unspecified: Secondary | ICD-10-CM | POA: Insufficient documentation

## 2011-10-04 DIAGNOSIS — E7800 Pure hypercholesterolemia, unspecified: Secondary | ICD-10-CM | POA: Insufficient documentation

## 2011-10-04 LAB — POCT I-STAT, CHEM 8
BUN: 71 mg/dL — ABNORMAL HIGH (ref 6–23)
Potassium: 3.9 mEq/L (ref 3.5–5.1)
Sodium: 147 mEq/L — ABNORMAL HIGH (ref 135–145)
TCO2: 25 mmol/L (ref 0–100)

## 2011-10-04 LAB — URINALYSIS, MICROSCOPIC ONLY
Glucose, UA: >1000 mg/dL — AB
Ketones, ur: NEGATIVE mg/dL
Leukocytes, UA: NEGATIVE
Nitrite: NEGATIVE
Specific Gravity, Urine: 1.019 (ref 1.005–1.030)
pH: 5.5 (ref 5.0–8.0)

## 2011-10-04 LAB — CBC
HCT: 32.5 % — ABNORMAL LOW (ref 39.0–52.0)
Hemoglobin: 11.1 g/dL — ABNORMAL LOW (ref 13.0–17.0)
Platelets: 72 10*3/uL — ABNORMAL LOW (ref 150–400)
RDW: 15.8 % — ABNORMAL HIGH (ref 11.5–15.5)

## 2011-10-04 LAB — DIFFERENTIAL
Basophils Absolute: 0 10*3/uL (ref 0.0–0.1)
Eosinophils Relative: 0 % (ref 0–5)
Lymphocytes Relative: 3 % — ABNORMAL LOW (ref 12–46)
Monocytes Relative: 5 % (ref 3–12)
Neutrophils Relative %: 92 % — ABNORMAL HIGH (ref 43–77)

## 2011-10-04 MED ORDER — CIPROFLOXACIN HCL 500 MG PO TABS: 500.0000 mg | ORAL_TABLET | Freq: Two times a day (BID) | ORAL | Status: AC

## 2011-10-04 NOTE — ED Notes (Signed)
Patient presents with c/o of urinary retention.  Recently had brain surgery and was discharged from [facility] on 1/31 at 1030am.  Has been unable to empty his bladder since then.

## 2011-10-04 NOTE — ED Notes (Signed)
PT. REPORTS URINARY RETENTION AND BLADDER PRESSURE /DISCOMFORT ONSET THIS EVENING , STATES RECENTLY DISCHARGE FROM HOSPITAL YESTERDAY S/P BRAIN SURGERY BY DR. CRAM - BRAIN CANCER.

## 2011-10-04 NOTE — ED Provider Notes (Signed)
Medical screening examination/treatment/procedure(s) were conducted as a shared visit with non-physician practitioner(s) and myself.  I personally evaluated the patient during the encounter Alert scalp incision CDI RRR CTAB NABS   Foley antibiotics and follow up with urology for voiding trial in 7 days  Trenace Coughlin Smitty Cords, MD 10/04/11 [phone]

## 2011-10-04 NOTE — ED Provider Notes (Signed)
History     CSN: [id number]  Arrival date & time 10/04/11  0505   First MD Initiated Contact with Patient 10/04/11 [phone]      Chief Complaint  Patient presents with  . Urinary Retention    (Consider location/radiation/quality/duration/timing/severity/associated sxs/prior treatment) Patient is a 77 y.o. male presenting with dysuria. The history is provided by the patient.  Dysuria  This is a new problem. The current episode started yesterday. Pertinent negatives include no chills and no hematuria.  Pt states he was discharged from the hospital yesterday after a brain surgery. Pt states he has no complaints other then he is unable to urinate, has suprapubic pressure. States moving his bowels well. Denies fever, chills, malaise. States history of the same after a lung surgery several years ago, and states he does have an enlarged prostate and sees an urologist in [place].   Past Medical History  Diagnosis Date  . CAD (coronary artery disease)     -s/p Cypher drug eluting stent to LCX for unstable angina in 2006  . Renal artery stenosis   . HTN (hypertension)   . Hypercholesterolemia   . Renal insufficiency     baseline Cr 1.5-1.8  . PAD (peripheral artery disease)   . 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 Torticollis   . Opacity, cornea, central     RUL opacity on CXR 9/10 - refused CT  . Adenocarcinoma     Right upper lobe   . Coordination impairment     Right Parietal Mass  . Weakness 08/20/11    Brain Mets - Left Leg and Arm Left pronator drift  . Brain cancer      Mets - Right Parietal mass - Vasogenic Edema    Past Surgical History  Procedure Date  . Coronary stent placement   . Bronchoscopy 04/10/2010    [place]  . Rt vats, rt upper lobectomy, mediastinal lymph node dissection 04/10/2010    [place]  . Left kidney stent placement   . Hemorroidectomy   . Craniotomy 09/30/2011    Procedure: CRANIOTOMY TUMOR EXCISION;   Surgeon: Mariam Dollar, MD;  Location: [facility];  Service: Neurosurgery;  Laterality: Right;  Right Frontal Parietal Stealth Craniotomy    Family History  Problem Relation Age of Onset  . Breast cancer Mother   . Peripheral vascular disease Sister     requiring on amputation  . Aneurysm Father     History  Substance Use Topics  . Smoking status: Current Everyday Smoker -- 0.5 packs/day for 56 years    Types: Cigarettes  . Smokeless tobacco: Not on file   Comment: Smoked 4-5 cigs per day, as he has for more than 40 years.   . Alcohol Use: No     Hx of Alcohol Abuse      Review of Systems  Constitutional: Negative for fever and chills.  HENT: Negative.   Eyes: Negative.   Respiratory: Negative.   Cardiovascular: Negative.   Gastrointestinal: Positive for abdominal pain and abdominal distention.  Genitourinary: Positive for dysuria, decreased urine volume and difficulty urinating. Negative for hematuria.  Musculoskeletal: Negative.   Skin: Negative.   Neurological: Negative for dizziness, weakness and numbness.  Psychiatric/Behavioral: Negative.     Allergies  Sulfonamide derivatives  Home Medications   Current Outpatient Rx  Name Route Sig Dispense Refill  . ACETAMINOPHEN-CODEINE #3 300-30 MG PO TABS Oral Take 1-2 tablets by mouth every 4 (  four) hours as needed. 60 tablet 1  . ALISKIREN FUMARATE 300 MG PO TABS Oral Take 300 mg by mouth daily.     Marland Kitchen AMLODIPINE BESYLATE 10 MG PO TABS Oral Take 10 mg by mouth daily.     . ASPIRIN 81 MG PO TBEC Oral Take 81 mg by mouth daily.      Marland Kitchen CARVEDILOL 25 MG PO TABS Oral Take 25 mg by mouth 2 (two) times daily.      Marland Kitchen CLONIDINE HCL 0.3 MG PO TABS Oral Take 0.3 mg by mouth 2 (two) times daily.    Marland Kitchen CLOPIDOGREL BISULFATE 75 MG PO TABS Oral Take 75 mg by mouth daily.      Marland Kitchen DEXAMETHASONE 2 MG PO TABS Oral Take 1 tablet (2 mg total) by mouth every 12 (twelve) hours. 30 tablet 0  . DOCUSATE SODIUM 100 MG PO CAPS Oral Take 100 mg by  mouth 2 (two) times daily.      Marland Kitchen DOXAZOSIN MESYLATE 1 MG PO TABS Oral Take 1 mg by mouth at bedtime.    Marland Kitchen HYDROCHLOROTHIAZIDE 25 MG PO TABS Oral Take 25 mg by mouth daily.      Marland Kitchen LORAZEPAM 0.5 MG PO TABS Oral Take 0.5 mg by mouth at bedtime as needed. For anxiety    . PANTOPRAZOLE SODIUM 40 MG PO TBEC Oral Take 1 tablet (40 mg total) by mouth daily at 12 noon. 60 tablet 1  . SIMVASTATIN 40 MG PO TABS Oral Take 40 mg by mouth at bedtime.      . TAMSULOSIN HCL 0.4 MG PO CAPS Oral Take 0.4 mg by mouth daily.        BP 153/84  Pulse 90  Temp(Src) 97.5 F (36.4 C) (Oral)  Resp 20  SpO2 96%  Physical Exam  Nursing note and vitals reviewed. Constitutional: He is oriented to person, place, and time. He appears well-developed and well-nourished. No distress.  Eyes: Conjunctivae are normal.  Neck: Neck supple.  Cardiovascular: Normal rate, regular rhythm and normal heart sounds.   Pulmonary/Chest: Effort normal and breath sounds normal. No respiratory distress. He has no wheezes.  Abdominal: Soft. Bowel sounds are normal. There is no guarding.       Foley already placed, abdomen soft, non tender, no distention.  Musculoskeletal: Normal range of motion. He exhibits no edema.  Neurological: He is alert and oriented to person, place, and time.  Skin: Skin is warm and dry.  Psychiatric: He has a normal mood and affect.    ED Course  Procedures (including critical care time)  Pt with urinary retention. Foley already placed about 950cc out. Pt feeling much better. UA not infected. Chronic renal insufficiency. Abdomen non tender. VS normal other then hypertensive at 153/84. Will d/c home with urology follow up. With a foley. Started on cipron. Labs Reviewed  POCT I-STAT, CHEM 8 - Abnormal; Notable for the following:    Sodium 147 (*)    Chloride 113 (*)    BUN 71 (*)    Creatinine, Ser 1.80 (*)    Glucose, Bld 205 (*)    Calcium, Ion 1.35 (*)    Hemoglobin 10.9 (*)    HCT 32.0 (*)    All  other components within normal limits  CBC  DIFFERENTIAL  URINALYSIS, WITH MICROSCOPIC  URINE CULTURE    No diagnosis found.    MDM  Urinary retention, post operative. Foley placed, started on antibiotic. Pt feeling much better. Will follow up with urology  outpatient.         Lottie Mussel, [place] 10/04/11 [phone]

## 2011-10-05 LAB — URINE CULTURE: Culture  Setup Time: 201302010712

## 2011-10-07 ENCOUNTER — Encounter: Payer: Self-pay | Admitting: Radiation Oncology

## 2011-10-07 ENCOUNTER — Other Ambulatory Visit: Payer: Self-pay | Admitting: Radiation Oncology

## 2011-10-08 ENCOUNTER — Encounter: Payer: Self-pay | Admitting: *Deleted

## 2011-10-08 ENCOUNTER — Other Ambulatory Visit: Payer: Self-pay | Admitting: *Deleted

## 2011-10-08 MED ORDER — DOXAZOSIN MESYLATE 1 MG PO TABS: 1.0000 mg | ORAL_TABLET | Freq: Every day | ORAL | Status: DC

## 2011-10-09 ENCOUNTER — Encounter: Payer: Self-pay | Admitting: Radiation Oncology

## 2011-10-09 ENCOUNTER — Ambulatory Visit (HOSPITAL_COMMUNITY)
Admission: RE | Admit: 2011-10-09 | Discharge: 2011-10-09 | Disposition: A | Discharge status: Home / Self Care | Payer: Medicare Other | Source: Ambulatory Visit | Attending: Radiation Oncology | Admitting: Radiation Oncology

## 2011-10-09 ENCOUNTER — Ambulatory Visit: Payer: Medicare Other

## 2011-10-09 ENCOUNTER — Ambulatory Visit
Admission: RE | Admit: 2011-10-09 | Discharge: 2011-10-09 | Disposition: A | Discharge status: Home / Self Care | Payer: Medicare Other | Source: Ambulatory Visit | Attending: Radiation Oncology | Admitting: Radiation Oncology

## 2011-10-09 ENCOUNTER — Telehealth: Payer: Self-pay | Admitting: Radiation Oncology

## 2011-10-09 DIAGNOSIS — C801 Malignant (primary) neoplasm, unspecified: Secondary | ICD-10-CM

## 2011-10-09 DIAGNOSIS — C7931 Secondary malignant neoplasm of brain: Secondary | ICD-10-CM

## 2011-10-09 DIAGNOSIS — R5383 Other fatigue: Secondary | ICD-10-CM | POA: Insufficient documentation

## 2011-10-09 DIAGNOSIS — R0602 Shortness of breath: Secondary | ICD-10-CM | POA: Insufficient documentation

## 2011-10-09 DIAGNOSIS — R5381 Other malaise: Secondary | ICD-10-CM | POA: Insufficient documentation

## 2011-10-09 DIAGNOSIS — J438 Other emphysema: Secondary | ICD-10-CM | POA: Insufficient documentation

## 2011-10-09 HISTORY — DX: Presence of urogenital implants: Z96.0

## 2011-10-09 HISTORY — DX: Presence of other specified devices: Z97.8

## 2011-10-09 NOTE — Telephone Encounter (Signed)
Informed patient of CXR results by phone.

## 2011-10-09 NOTE — Progress Notes (Signed)
Mr.  Hopkin in today S/P Craniotomy for a Right Frontal Parietal Mass.  Denies any headaches, nausea/vomiting, but states he notes some blurred vision since he started his steroids.  Gait unsteady.  Incision intact with sutures - No redness noted.  Mouth clear, but tongue dry.  Encouraged more fluid intake. VSS.  Accompanied by his daughter Hilary Hertz.  Family takes turns staying with patient.  Pitting edema noted lower legs and feet.  Denies any pain in feet.  Has Indwelling catheter in place since surgery.

## 2011-10-09 NOTE — Progress Notes (Signed)
[facility]cology Follow up Note  Name: [name]   Date: 10/09/2011    MRN: [id number] DOB: Oct 23, 1934  CC:  Jaclyn Shaggy, MD, MD  Mariam Dollar, MD   DIAGNOSIS: Non-small cell lung cancer, metastatic to the brain   Narrative: The patient returns for followup today. I scheduled a followup for the patient after I saw in the medical record that he had undergone a craniotomy on 09/30/2011. I have reviewed his pathology report. The tumor that was resected from the patient's right frontal parietal lobe contained adenocarcinoma consistent with a lung primary.  He reports that he's been more short of breath since his surgery. He has noticed progressive swelling in his lower extremities. He is currently taking dexamethasone 2 mg twice a day. He continues to have weakness in his legs.    ALLERGIES: Sulfonamide derivatives   MEDICATIONS:  Current Outpatient Prescriptions  Medication Sig Dispense Refill  . acetaminophen-codeine (TYLENOL #3) 300-30 MG per tablet Take 1-2 tablets by mouth every 4 (four) hours as needed.  60 tablet  1  . aliskiren (TEKTURNA) 300 MG tablet Take 300 mg by mouth daily.       Marland Kitchen amLODipine (NORVASC) 10 MG tablet Take 10 mg by mouth daily.       Marland Kitchen aspirin (ASPIR-81) 81 MG EC tablet Take 81 mg by mouth daily.        . carvedilol (COREG) 25 MG tablet Take 25 mg by mouth 2 (two) times daily.        . ciprofloxacin (CIPRO) 500 MG tablet Take 1 tablet (500 mg total) by mouth 2 (two) times daily.  14 tablet  0  . cloNIDine (CATAPRES) 0.3 MG tablet Take 0.3 mg by mouth 2 (two) times daily.      . clopidogrel (PLAVIX) 75 MG tablet Take 75 mg by mouth daily.        . cyclobenzaprine (FLEXERIL) 10 MG tablet Take 10 mg by mouth 3 (three) times daily as needed.      Marland Kitchen dexamethasone (DECADRON) 2 MG tablet Take 1 tablet (2 mg total) by mouth every 12 (twelve) hours.  30 tablet  0  . docusate sodium (COLACE) 100 MG capsule Take 100 mg by mouth 2 (two)  times daily.        Marland Kitchen doxazosin (CARDURA) 1 MG tablet Take 1 tablet (1 mg total) by mouth at bedtime.  30 tablet  0  . hydrochlorothiazide 25 MG tablet Take 25 mg by mouth daily.        Marland Kitchen LORazepam (ATIVAN) 0.5 MG tablet Take 0.5 mg by mouth at bedtime as needed. For anxiety      . pantoprazole (PROTONIX) 40 MG tablet Take 1 tablet (40 mg total) by mouth daily at 12 noon.  60 tablet  1  . simvastatin (ZOCOR) 40 MG tablet Take 40 mg by mouth at bedtime.        . Tamsulosin HCl (FLOMAX) 0.4 MG CAPS Take 0.4 mg by mouth daily.            PHYSICAL EXAM:   weight is 131 lb 8 oz (59.648 kg). His temperature is 98.1 F (36.7 C). His blood pressure is 119/71 and his pulse is 94.   He is chronically ill-appearing. There are sutures over his scalp from his craniotomy .  He has chronic torticollis of his neck. He has decreased strength in his lower extremities bilaterally. Strength is intact in his upper extremities. He has  pitting edema in his ankles bilaterally. His lungs are clear to auscultation bilaterally.  LABORATORY DATA:  Lab Results  Component Value Date   WBC 7.6 10/04/2011   HGB 10.9* 10/04/2011   HCT 32.0* 10/04/2011   MCV 90.8 10/04/2011   PLT 72* 10/04/2011   Lab Results  Component Value Date   NA 147* 10/04/2011   K 3.9 10/04/2011   CL 113* 10/04/2011   CO2 23 09/24/2011   Lab Results  Component Value Date   ALT 16 09/05/2011   AST 12 09/05/2011   ALKPHOS 37* 09/05/2011   BILITOT 0.3 09/05/2011       RADIOGRAPHIC STUDIES:  I ordered a chest x-ray after his followup due to shortness of breath and fluid retention. This was positive for emphysema but negative for any acute changes  Pathology as above   IMPRESSION: [place]velt Harps: I will simulate the patient today for whole brain radiotherapy. He is still interested in whole brain radiotherapy rather than stereotactic radiosurgery. I plan to treat his whole brain to 30 gray in 10 fractions. He has a followup scheduled on Tuesday, February 12 with Dr.  Wynetta Emery. I will schedule for his first fraction of radiotherapy to be given on February 13. I think the patient will be ready for treatment by then. If Dr. Wynetta Emery disagrees, he can contact my clinic. I will be on vacation next week but Dr. Dayton Scrape will be covering for me.

## 2011-10-09 NOTE — Progress Notes (Signed)
Please see the Nurse Progress Note in the MD Initial Consult Encounter for this patient. 

## 2011-10-09 NOTE — Progress Notes (Signed)
Simulation / Treatment Planning Note  The patient was taken to her CT simulator and laid in the supine position. An Aquaplast facemask was made and custom fitted to the patient's anatomy. High-resolution CT axial imaging was obtained of the patient's brain. An isocenter was placed in the brain. Markings were made on the patient's mask and the patient tolerated this well.  Treatment planning note: I plan to treat the patient's brain with opposed lateral beams using MLCs for custom blocks. I plan to prescribe 30 Gray in 10 fractions to the whole brain.

## 2011-10-11 NOTE — Progress Notes (Signed)
Encounter addended by: Audric Venn Mintz Escher Harr, RN on: 10/11/2011  4:34 PM<BR>     Documentation filed: Charges VN

## 2011-10-16 ENCOUNTER — Encounter: Payer: Self-pay | Admitting: Radiation Oncology

## 2011-10-16 ENCOUNTER — Ambulatory Visit
Admission: RE | Admit: 2011-10-16 | Discharge: 2011-10-16 | Disposition: A | Discharge status: Home / Self Care | Payer: Medicare Other | Source: Ambulatory Visit | Attending: Radiation Oncology | Admitting: Radiation Oncology

## 2011-10-16 NOTE — Progress Notes (Signed)
Simulation verification note: The patient underwent similar to verification for treatment to his whole brain. His isocenter is in good position and the multileaf collimators contour the treatment volume appropriately.

## 2011-10-17 ENCOUNTER — Ambulatory Visit
Admission: RE | Admit: 2011-10-17 | Discharge: 2011-10-17 | Disposition: A | Discharge status: Home / Self Care | Payer: Medicare Other | Source: Ambulatory Visit | Attending: Radiation Oncology | Admitting: Radiation Oncology

## 2011-10-17 ENCOUNTER — Ambulatory Visit: Payer: Medicare Other

## 2011-10-18 ENCOUNTER — Ambulatory Visit
Admission: RE | Admit: 2011-10-18 | Discharge: 2011-10-18 | Disposition: A | Discharge status: Home / Self Care | Payer: Medicare Other | Source: Ambulatory Visit | Attending: Radiation Oncology | Admitting: Radiation Oncology

## 2011-10-21 ENCOUNTER — Ambulatory Visit
Admission: RE | Admit: 2011-10-21 | Discharge: 2011-10-21 | Disposition: A | Discharge status: Home / Self Care | Payer: Medicare Other | Source: Ambulatory Visit | Attending: Radiation Oncology | Admitting: Radiation Oncology

## 2011-10-21 DIAGNOSIS — C7931 Secondary malignant neoplasm of brain: Secondary | ICD-10-CM

## 2011-10-21 NOTE — Progress Notes (Signed)
Pt  In w/c,  Daughter at side, hard of hearing, but answers appropriately, denies pain, , no nausea, no blurred vision, no head ache, just weak in his legs stated by patient 4/10 whole brain rad tx completed declined to be weighed topday, has 5 steps to get into house, , states he's got a good appetite,eating well, both feet pitting edema,pt puts feet up when he gets down 10:41 AM

## 2011-10-21 NOTE — Progress Notes (Signed)
Weekly Management Note:  Site:Whole brain Current Dose:  1200  cGy Projected Dose: 3000  cGy  Narrative: The patient is seen today for routine under treatment assessment. CBCT/MVCT images/port films were reviewed. The chart was reviewed.   His daughter is with him today. His major complaint is that of lower extremity weakness which has been present for the past few months. He has been on dexamethasone since December. He is currently taking 4 mg of dexamethasone twice a day.  Physical Examination: There were no vitals filed for this visit..  Weight:  . No change. He has significant weakness of his proximal lower extremities with wasting.  Impression: Tolerating radiation therapy well. 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  Plan: Continue radiation therapy as planned. He'll remain at 4 mg of dexamethasone twice a day.

## 2011-10-22 ENCOUNTER — Ambulatory Visit
Admission: RE | Admit: 2011-10-22 | Discharge: 2011-10-22 | Disposition: A | Discharge status: Home / Self Care | Payer: Medicare Other | Source: Ambulatory Visit | Attending: Radiation Oncology | Admitting: Radiation Oncology

## 2011-10-23 ENCOUNTER — Ambulatory Visit
Admission: RE | Admit: 2011-10-23 | Discharge: 2011-10-23 | Disposition: A | Discharge status: Home / Self Care | Payer: Medicare Other | Source: Ambulatory Visit | Attending: Radiation Oncology | Admitting: Radiation Oncology

## 2011-10-23 ENCOUNTER — Encounter: Payer: Self-pay | Admitting: Radiation Oncology

## 2011-10-23 DIAGNOSIS — C7931 Secondary malignant neoplasm of brain: Secondary | ICD-10-CM

## 2011-10-23 NOTE — Progress Notes (Signed)
WEIGHT LOSS 6 LBS IN 1 WEEK.  SAYS HE IS TIRED AND GOES TO BED EARLY SO HE MISSES SUPPER A LOT OF TIMES.  SAYS HE DRINKS 2 CANS OF ENSURE DAILY PER PT. SAYS HE EATS BETTER ON WEEK-ENDS BECAUSE HIS SISTERS BRING HIM FOOD ON WEEK-ENDS.  TAKES DECADRON 2MG  BID BUT WAS TAKING TOO MUCH BEFORE, 4MG  BID.  FAMILY WILL MAKE SURE HE TAKES 2MG  BID NOW , WHICH IS CORRECT DOSE.

## 2011-10-23 NOTE — Progress Notes (Signed)
   Weekly Management Note Current Dose:   1800cGy  Projected Dose:  3000cGy   Narrative:  The patient presents for routine under treatment assessment.  CBCT/MVCT images/Port film x-rays were reviewed.  The chart was checked. He is feeling relatively well. The swelling in his lower extremities has improved. He is taking dexamethasone 4 mg twice a day. However he found out from the pharmacist that he is supposed to take 2 mg twice a day since going to start that. He denies headaches or nausea  Physical Findings: Weight:  . There were no vitals filed for this visit. however per my nurse, he has lost 6 pounds in one week  He has no oropharyngeal thrush. He has no significant alopecia over his scalp thus far.   Impression:  The patient is tolerating radiotherapy.  Plan:  Continue radiotherapy as planned. Next week when I see him we may be able to taper his dexamethasone further. For now he'll take 2 mg twice a day.

## 2011-10-24 ENCOUNTER — Ambulatory Visit
Admission: RE | Admit: 2011-10-24 | Discharge: 2011-10-24 | Disposition: A | Discharge status: Home / Self Care | Payer: Medicare Other | Source: Ambulatory Visit | Attending: Radiation Oncology | Admitting: Radiation Oncology

## 2011-10-25 ENCOUNTER — Ambulatory Visit
Admission: RE | Admit: 2011-10-25 | Discharge: 2011-10-25 | Disposition: A | Discharge status: Home / Self Care | Payer: Medicare Other | Source: Ambulatory Visit | Attending: Radiation Oncology | Admitting: Radiation Oncology

## 2011-10-25 ENCOUNTER — Encounter: Payer: Self-pay | Admitting: *Deleted

## 2011-10-25 NOTE — Progress Notes (Signed)
[facility] Psychosocial Distress Screening Clinical Social Work  Clinical Social Work was referred by distress screening protocol.  The patient scored a 7 on the Psychosocial Distress Thermometer which indicates moderate distress. Doctoral counseling intern Juanetta Beets, [facility], called patient to assess for distress and other psychosocial needs. The patient stated he was having surgery and reported his distress was now a 5 out of 10. Patient denied counseling services and [employer] provided contact information should any needs arise.   Clinical Social Worker follow up needed: no  If yes, follow up plan:  Kathrin Penner, MSW, [facility] Clinical Social Worker [facility] [phone]

## 2011-10-28 ENCOUNTER — Ambulatory Visit
Admission: RE | Admit: 2011-10-28 | Discharge: 2011-10-28 | Disposition: A | Discharge status: Home / Self Care | Payer: Medicare Other | Source: Ambulatory Visit | Attending: Radiation Oncology | Admitting: Radiation Oncology

## 2011-10-28 ENCOUNTER — Encounter: Payer: Self-pay | Admitting: Radiation Oncology

## 2011-10-28 ENCOUNTER — Telehealth: Payer: Self-pay | Admitting: Radiation Oncology

## 2011-10-28 DIAGNOSIS — C7931 Secondary malignant neoplasm of brain: Secondary | ICD-10-CM

## 2011-10-28 NOTE — Progress Notes (Signed)
C/O "STARVING", GAVE PT A PACK OF COOKIES AND HE ATE THEM ALL WITHOUT PROBLEMS.  NO OTHER C/O

## 2011-10-28 NOTE — Progress Notes (Signed)
   Weekly Management Note Current Dose:   2700 cGy  Projected Dose: 3000 cGy   Narrative:  The patient presents for routine under treatment assessment.  CBCT/MVCT images/Port film x-rays were reviewed.  The chart was checked. He denies headaches or nausea.  He is taking Dexamethasome 2mg  BID. Finishes radiotherapy to his whole brain tomorrow.  Physical Findings: Weight:  . There were no vitals filed for this visit. He had no oropharyngeal thrush. He is sitting in a wheelchair.  Impression:  The patient is tolerating radiotherapy.  Plan:  Continue radiotherapy as planned. I have instructed his daughter to taper his Dexamethasone to 2 mg daily for 1 week, and the 2mg  every other day for the week thereafter. Then, he can stop his steroid.  His daughter is quite stressed helping out the patient and she expressed her stressors with me. She would very much like to talk to our social worker and I will make that referral. He has seen his neurosurgeon on March 12. He has an appointment with Dr. Shirline Frees in April. I will cc this note to Dr. Arbutus Ped in case he would like to see the patient sooner.

## 2011-10-29 ENCOUNTER — Ambulatory Visit
Admission: RE | Admit: 2011-10-29 | Discharge: 2011-10-29 | Disposition: A | Discharge status: Home / Self Care | Payer: Medicare Other | Source: Ambulatory Visit | Attending: Radiation Oncology | Admitting: Radiation Oncology

## 2011-10-29 NOTE — Telephone Encounter (Signed)
Please see progress note from 10-28-11.

## 2011-10-30 ENCOUNTER — Ambulatory Visit: Payer: Medicare Other

## 2011-10-31 ENCOUNTER — Ambulatory Visit: Payer: Medicare Other

## 2011-11-01 ENCOUNTER — Ambulatory Visit: Payer: Medicare Other

## 2011-11-04 ENCOUNTER — Ambulatory Visit: Payer: Medicare Other

## 2011-11-05 ENCOUNTER — Encounter: Payer: Self-pay | Admitting: Radiation Oncology

## 2011-11-05 NOTE — Progress Notes (Signed)
[facility]  Name:[name]  Date:11/05/2011           ZOX:[id number] DOB:05/19/1935   Status:outpatient     DIAGNOSIS: Metastatic lung cancer to the brain    INDICATION FOR TREATMENT: Palliative   TREATMENT DATES: 10/16/11 - 10/29/11                          SITE/DOSE:     Whole brain / 3000cGy in 10 fractions                       BEAMS/ENERGY:   Opposed lateral / 6 MV photons               NARRATIVE:          The patient tolerated treatments well without complications. Steroid taper was in place at completion of therapy. Social work referral was made due to family difficulties in helping patient with his ADLs.                  PLAN: Routine followup in one month. Patient instructed to call if questions or worsening complaints in interim.

## 2011-11-17 ENCOUNTER — Encounter (HOSPITAL_COMMUNITY): Payer: Self-pay | Admitting: Emergency Medicine

## 2011-11-17 ENCOUNTER — Emergency Department (HOSPITAL_COMMUNITY)
Admission: EM | Admit: 2011-11-17 | Discharge: 2011-11-17 | Disposition: A | Discharge status: Home / Self Care | Payer: Medicare Other | Attending: Emergency Medicine | Admitting: Emergency Medicine

## 2011-11-17 DIAGNOSIS — R339 Retention of urine, unspecified: Secondary | ICD-10-CM

## 2011-11-17 DIAGNOSIS — I739 Peripheral vascular disease, unspecified: Secondary | ICD-10-CM | POA: Insufficient documentation

## 2011-11-17 DIAGNOSIS — I2510 Atherosclerotic heart disease of native coronary artery without angina pectoris: Secondary | ICD-10-CM | POA: Insufficient documentation

## 2011-11-17 DIAGNOSIS — E876 Hypokalemia: Secondary | ICD-10-CM

## 2011-11-17 DIAGNOSIS — I10 Essential (primary) hypertension: Secondary | ICD-10-CM | POA: Insufficient documentation

## 2011-11-17 DIAGNOSIS — E7800 Pure hypercholesterolemia, unspecified: Secondary | ICD-10-CM | POA: Insufficient documentation

## 2011-11-17 DIAGNOSIS — R109 Unspecified abdominal pain: Secondary | ICD-10-CM | POA: Insufficient documentation

## 2011-11-17 DIAGNOSIS — Z85841 Personal history of malignant neoplasm of brain: Secondary | ICD-10-CM | POA: Insufficient documentation

## 2011-11-17 DIAGNOSIS — R51 Headache: Secondary | ICD-10-CM | POA: Insufficient documentation

## 2011-11-17 DIAGNOSIS — Z79899 Other long term (current) drug therapy: Secondary | ICD-10-CM | POA: Insufficient documentation

## 2011-11-17 LAB — POCT I-STAT, CHEM 8
BUN: 25 mg/dL — ABNORMAL HIGH (ref 6–23)
Hemoglobin: 11.2 g/dL — ABNORMAL LOW (ref 13.0–17.0)
Potassium: 3.2 mEq/L — ABNORMAL LOW (ref 3.5–5.1)
Sodium: 142 mEq/L (ref 135–145)
TCO2: 29 mmol/L (ref 0–100)

## 2011-11-17 LAB — URINALYSIS, ROUTINE W REFLEX MICROSCOPIC
Glucose, UA: NEGATIVE mg/dL
Leukocytes, UA: NEGATIVE
Protein, ur: NEGATIVE mg/dL
Specific Gravity, Urine: 1.01 (ref 1.005–1.030)
Urobilinogen, UA: 0.2 mg/dL (ref 0.0–1.0)

## 2011-11-17 MED ORDER — POTASSIUM CHLORIDE ER 10 MEQ PO TBCR: 20.0000 meq | EXTENDED_RELEASE_TABLET | Freq: Two times a day (BID) | ORAL | Status: DC

## 2011-11-17 NOTE — ED Notes (Signed)
Pt to be discharged home with urinary catheter in place. Follow up with urology per EDP

## 2011-11-17 NOTE — ED Notes (Signed)
Pt reports unable to urinate since late last night. Pt had surgery in January and foley was removed on Wednesday.

## 2011-11-17 NOTE — ED Provider Notes (Signed)
History     CSN: [id number]  Arrival date & time 11/17/11  1009   First MD Initiated Contact with Patient 11/17/11 1038      Chief Complaint  Patient presents with  . Urinary Retention    (Consider location/radiation/quality/duration/timing/severity/associated sxs/prior treatment) The history is provided by the patient and a relative.   patient has had a Foley since January after brain surgery. His been attempted to be removed a couple times and has always had to be replaced. Room was removed on Wednesday he was able to urinate Thursday and Friday, was unable to urinate today. He feels as if he has to go. No fevers. He states he rode his exercise bike that did not help. No fevers. No chest pain. He feels as if he has to urinate at this time.  Past Medical History  Diagnosis Date  . CAD (coronary artery disease)     -s/p Cypher drug eluting stent to LCX for unstable angina in 2006  . Renal artery stenosis   . HTN (hypertension)   . Hypercholesterolemia   . Renal insufficiency     baseline Cr 1.5-1.8  . PAD (peripheral artery disease)   . 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 Torticollis   . Opacity, cornea, central     RUL opacity on CXR 9/10 - refused CT  . Adenocarcinoma     Right upper lobe   . Coordination impairment     Right Parietal Mass  . Weakness 08/20/11    Brain Mets - Left Leg and Arm Left pronator drift  . Brain cancer      Mets - Right Parietal mass - Vasogenic Edema  . Foley catheter in place     Since Surgery 09/30/11    Past Surgical History  Procedure Date  . Coronary stent placement   . Bronchoscopy 04/10/2010    [facility]  . Rt vats, rt upper lobectomy, mediastinal lymph node dissection 04/10/2010    [facility]  . Left kidney stent placement   . Hemorroidectomy   . Craniotomy 09/30/2011    Procedure: CRANIOTOMY TUMOR EXCISION;  Surgeon: Mariam Dollar, MD;  Location: [facility];  Service: Neurosurgery;   Laterality: Right;  Right Frontal Parietal Stealth Craniotomy    Family History  Problem Relation Age of Onset  . Breast cancer Mother   . Cancer Mother     Breast Cancer  . Peripheral vascular disease Sister     requiring on amputation  . Aneurysm Father   . Cancer Maternal Aunt     Breast Cancer    History  Substance Use Topics  . Smoking status: Current Everyday Smoker -- 0.5 packs/day for 56 years    Types: Cigarettes  . Smokeless tobacco: Not on file   Comment: Smoked 4-5 cigs per day, as he has for more than 40 years.  10/09/11 Continues to smoke ~ 2 cigarettes a day since his surgery on 09/30/11  . Alcohol Use: No     Hx of Alcohol Abuse      Review of Systems  Constitutional: Negative for fever and chills.  HENT: Negative for congestion.   Respiratory: Negative for chest tightness.   Cardiovascular: Negative for chest pain.  Gastrointestinal: Positive for abdominal pain.  Genitourinary: Positive for decreased urine volume.       Urinary retention  Musculoskeletal: Negative for back pain.  Neurological: Positive for headaches.    Allergies  Sulfonamide derivatives  Home Medications   Current Outpatient Rx  Name Route Sig Dispense Refill  . ALISKIREN FUMARATE 300 MG PO TABS Oral Take 300 mg by mouth daily.     Marland Kitchen AMLODIPINE BESYLATE 10 MG PO TABS Oral Take 10 mg by mouth daily.     Marland Kitchen CARVEDILOL 25 MG PO TABS Oral Take 25 mg by mouth 2 (two) times daily.      Marland Kitchen CLONIDINE HCL 0.3 MG PO TABS Oral Take 0.3 mg by mouth 2 (two) times daily.    . CYCLOBENZAPRINE HCL 10 MG PO TABS Oral Take 10 mg by mouth 3 (three) times daily as needed. For muscle spasms    . DOCUSATE SODIUM 100 MG PO CAPS Oral Take 100 mg by mouth 2 (two) times daily.      Marland Kitchen DOXAZOSIN MESYLATE 1 MG PO TABS Oral Take 1 mg by mouth at bedtime.    Marland Kitchen HYDROCHLOROTHIAZIDE 25 MG PO TABS Oral Take 25 mg by mouth daily.      Marland Kitchen LORAZEPAM 0.5 MG PO TABS Oral Take 0.5 mg by mouth at bedtime as needed. For  anxiety    . PANTOPRAZOLE SODIUM 40 MG PO TBEC Oral Take 40 mg by mouth daily at 12 noon.    Marland Kitchen SIMVASTATIN 40 MG PO TABS Oral Take 40 mg by mouth at bedtime.      . TAMSULOSIN HCL 0.4 MG PO CAPS Oral Take 0.4 mg by mouth daily.      Marland Kitchen POTASSIUM CHLORIDE ER 10 MEQ PO TBCR Oral Take 2 tablets (20 mEq total) by mouth 2 (two) times daily. 12 tablet 0    BP 120/97  Pulse 98  Temp(Src) 98 F (36.7 C) (Oral)  Resp 20  SpO2 99%  Physical Exam  Constitutional: He is oriented to person, place, and time. He appears well-developed.  HENT:  Head: Normocephalic.  Eyes:       Prosthetic right eye  Cardiovascular: Normal rate.   Pulmonary/Chest: Effort normal.  Abdominal: Soft.       Tenderness and fullness in suprapubic area. Likely distended bladder.  Genitourinary: Penis normal.  Neurological: He is alert and oriented to person, place, and time.    ED Course  Procedures (including critical care time)  Labs Reviewed  POCT I-STAT, CHEM 8 - Abnormal; Notable for the following:    Potassium 3.2 (*)    BUN 25 (*)    Glucose, Bld 123 (*)    Hemoglobin 11.2 (*)    HCT 33.0 (*)    All other components within normal limits  URINALYSIS, ROUTINE W REFLEX MICROSCOPIC   No results found.   1. Urinary retention   2. Hypokalemia       MDM  Urinary retention with recent Foley removal. Foley catheter is replaced with good results. Pain is improved. He does not have urinary tract infection. His kidney function is good, but he has some hypokalemia which will be supplemented at home. He will follow with his urologist        Juliet Rude. Rubin Payor, MD 11/17/11 1150

## 2011-11-17 NOTE — Discharge Instructions (Signed)
Hypokalemia Hypokalemia means a low potassium level in the blood.Potassium is an electrolyte that helps regulate the amount of fluid in the body. It also stimulates muscle contraction and maintains a stable acid-base balance.Most of the body's potassium is inside of cells, and only a very small amount is in the blood. Because the amount in the blood is so small, minor changes can have big effects. PREPARATION FOR TEST Testing for potassium requires taking a blood sample taken by needle from a vein in the arm. The skin is cleaned thoroughly before the sample is drawn. There is no other special preparation needed. NORMAL VALUES Potassium levels below 3.5 mEq/L are abnormally low. Levels above 5.1 mEq/L are abnormally high. Ranges for normal findings may vary among different laboratories and hospitals. You should always check with your doctor after having lab work or other tests done to discuss the meaning of your test results and whether your values are considered within normal limits. MEANING OF TEST  Your caregiver will go over the test results with you and discuss the importance and meaning of your results, as well as treatment options and the need for additional tests, if necessary. A potassium level is frequently part of a routine medical exam. It is usually included as part of a whole "panel" of tests for several blood salts (such as Sodium and Chloride). It may be done as part of follow-up when a low potassium level was found in the past or other blood salts are suspected of being out of balance. A low potassium level might be suspected if you have one or more of the following:  Symptoms of weakness.   Abnormal heart rhythms.   High blood pressure and are taking medication to control this, especially water pills (diuretics).   Kidney disease that can affect your potassium level .   Diabetes requiring the use of insulin. The potassium may fall after taking insulin, especially if the  diabetes had been out of control for a while.   A condition requiring the use of cortisone-type medication or certain types of antibiotics.   Vomiting and/or diarrhea for more than a day or two.   A stomach or intestinal condition that may not permit appropriate absorption of potassium.   Fainting episodes.   Mental confusion.  OBTAINING TEST RESULTS It is your responsibility to obtain your test results. Ask the lab or department performing the test when and how you will get your results.  Please contact your caregiver directly if you have not received the results within one week. At that time, ask if there is anything different or new you should be doing in relation to the results. TREATMENT Hypokalemia can be treated with potassium supplements taken by mouth and/or adjustments in your current medications. A diet high in potassium is also helpful. Foods with high potassium content are:  Peas, lentils, lima beans, nuts, and dried fruit.   Whole grain and bran cereals and breads.   Fresh fruit, vegetables (bananas, cantaloupe, grapefruit, oranges, tomatoes, honeydew melons, potatoes).   Orange and tomato juices.   Meats. If potassium supplement has been prescribed for you today or your medications have been adjusted, see your personal caregiver in time02 for a re-check.  SEEK MEDICAL CARE IF:  There is a feeling of worsening weakness.   You experience repeated chest palpitations.   You are diabetic and having difficulty keeping your blood sugars in the normal range.   You are experiencing vomiting and/or diarrhea.   You are having  difficulty with any of your regular medications.  SEEK IMMEDIATE MEDICAL CARE IF:  You experience chest pain, shortness of breath, or episodes of dizziness.   You have been having vomiting or diarrhea for more than 2 days.   You have a fainting episode.  MAKE SURE YOU:   Understand these instructions.   Will watch your condition.   Will  get help right away if you are not doing well or get worse.  Document Released: 08/19/2005 Document Revised: 08/08/2011 Document Reviewed: 07/30/2008 [facility] Patient Information 2012 [place], [place].Acute Urinary Retention, Male You have been seen by a caregiver today because of your inability to urinate (pass your water). This is a common problem in elderly males. As men age their prostates become larger and block the flow of urine from the bladder. This is usually a problem that has come on gradually. It is often first noticed by having to get up at night to urinate. This is because as the prostate enlarges it is more difficult to empty the bladder completely. Treatment may involve a one time catheterization to empty the bladder. This is putting in a tube to drain your urine. Then you and your personal caregiver can decide at your earliest convenience how to handle this problem in the future. It may also be a problem that may not recur for years. Sometimes this problem can be caused by medications. In this case, all that is often necessary is to discontinue the offending agent. If you are to leave the foley catheter (a long, narrow, hollow tube) in and go home with a drainage system, you will need to discuss the best course of action with your caregiver. While the catheter is in, maintain a good intake of fluids. Keep the drainage bag emptied and lower than your catheter. This is so contaminated (infected) urine will not be flowing back into your bladder. This could lead to a urinary tract infection. Only take over-the-counter or prescription medicines for pain, discomfort, or fever as directed by your caregiver.  SEEK IMMEDIATE MEDICAL CARE IF:  You develop chills, fever, or show signs of generalized illness that occurs prior to seeing your caregiver. Document Released: 11/25/2000 Document Revised: 08/08/2011 Document Reviewed: 08/10/2008 [facility] Patient Information 2012 [place], [place].

## 2011-11-22 ENCOUNTER — Ambulatory Visit: Payer: Medicare Other | Admitting: Radiation Oncology

## 2011-12-11 ENCOUNTER — Other Ambulatory Visit: Payer: Self-pay | Admitting: Cardiology

## 2011-12-11 DIAGNOSIS — I714 Abdominal aortic aneurysm, without rupture: Secondary | ICD-10-CM

## 2011-12-13 ENCOUNTER — Ambulatory Visit
Admission: RE | Admit: 2011-12-13 | Discharge: 2011-12-13 | Disposition: A | Discharge status: Home / Self Care | Payer: Medicare Other | Source: Ambulatory Visit | Attending: Radiation Oncology | Admitting: Radiation Oncology

## 2011-12-13 ENCOUNTER — Encounter: Payer: Self-pay | Admitting: Radiation Oncology

## 2011-12-13 VITALS — BP 124/108 | HR 86 | Temp 99.5°F | Resp 20

## 2011-12-13 DIAGNOSIS — C7931 Secondary malignant neoplasm of brain: Secondary | ICD-10-CM

## 2011-12-13 DIAGNOSIS — R630 Anorexia: Secondary | ICD-10-CM

## 2011-12-13 NOTE — Progress Notes (Signed)
[facility] Radiation Oncology Follow up Note  Name: [name]   Date:   12/13/2011 MRN:  [id number] DOB: April 15, 1935   DIAGNOSIS: Lung cancer, metastatic to the brain   INTERVAL SINCE LAST RADIATION: The patient completed 30 gray in 10 fractions on 10/29/2011 to the whole brain   NARRATIVE: The patient is doing relatively well. He still has quite a bit of fatigue. He has difficulty gaining weight. Today he could not stand up to get on the scale. However he feels that as much as he eats, he does not gain weight. He is quite frail. He is in a wheelchair today. He does have nausea but no vomiting. He denies any headaches. He denies any seizures. No back pain. He still copes with urinary retention. He is seen at the emergency room on March 17 and had his Foley catheter replaced for urinary retention. The patient follows up with Dr. Shirline Frees later this month. He'll undergo restaging CT scan before that. He is not taking any steroids at present    ALLERGIES: Sulfonamide derivatives   MEDICATIONS:  Current Outpatient Prescriptions  Medication Sig Dispense Refill  . aliskiren (TEKTURNA) 300 MG tablet Take 300 mg by mouth daily.       Marland Kitchen amLODipine (NORVASC) 10 MG tablet Take 10 mg by mouth daily.       . carvedilol (COREG) 25 MG tablet Take 25 mg by mouth 2 (two) times daily.        . cloNIDine (CATAPRES) 0.3 MG tablet Take 0.3 mg by mouth 2 (two) times daily.      . cyclobenzaprine (FLEXERIL) 10 MG tablet Take 10 mg by mouth 3 (three) times daily as needed. For muscle spasms      . docusate sodium (COLACE) 100 MG capsule Take 100 mg by mouth 2 (two) times daily.        Marland Kitchen doxazosin (CARDURA) 1 MG tablet Take 1 mg by mouth at bedtime.      . hydrochlorothiazide 25 MG tablet Take 25 mg by mouth daily.        Marland Kitchen LORazepam (ATIVAN) 0.5 MG tablet Take 0.5 mg by mouth at bedtime as needed. For anxiety      . pantoprazole (PROTONIX) 40 MG tablet Take 40 mg by mouth daily at 12 noon.       . potassium chloride (K-DUR) 10 MEQ tablet Take 2 tablets (20 mEq total) by mouth 2 (two) times daily.  12 tablet  0  . simvastatin (ZOCOR) 40 MG tablet Take 40 mg by mouth at bedtime.        . Tamsulosin HCl (FLOMAX) 0.4 MG CAPS Take 0.4 mg by mouth daily.            PHYSICAL EXAM:   oral temperature is 99.5 F (37.5 C). His blood pressure is 124/108 and his pulse is 86. His respiration is 20.  He is sitting in a chair, a wheelchair. Extraocular movements are intact in his left eye. He has a false right eye. He has no oropharyngeal thrush. Finger to nose testing is grossly intact. Rapidly alternating movements are grossly intact. His strength is 4/5 in the upper and lower extremities, symmetric. He is hard of hearing, has a hearing aid.   LABORATORY DATA:  Lab Results  Component Value Date   WBC 7.6 10/04/2011   HGB 11.2* 11/17/2011   HCT 33.0* 11/17/2011   MCV 90.8 10/04/2011   PLT 72* 10/04/2011   Lab Results  Component  Value Date   NA 142 11/17/2011   K 3.2* 11/17/2011   CL 103 11/17/2011   CO2 23 09/24/2011   Lab Results  Component Value Date   ALT 16 09/05/2011   AST 12 09/05/2011   ALKPHOS 37* 09/05/2011   BILITOT 0.3 09/05/2011     IMPRESSION/plan: Doing relatively well. The patient continues to have some fatigue after brain surgery and radiotherapy. I told the patient that after going through so much treatment, he may never reach his prior baseline. I explained that typically, fatigue from for radiation lasts no more than a month, however he may have some residual effects from the surgery and radiation combined.   I will see him back on a when necessary basis. In the meantime, the patient will followup with medical oncology, for restaging and possible systemic therapy if his performance status is adequate.  I'll make referral to nutrition in light of his trouble with weight gain.

## 2011-12-13 NOTE — Progress Notes (Signed)
HERE TODAY FOR FU OF LUNG WITH BRAIN METS.  NO C/O PAIN BUT SAYS HE IS JUST VERY WEAK..  CONTINUES TO HAVE TREMORS.  UNABLE TO WEIGH

## 2011-12-16 ENCOUNTER — Other Ambulatory Visit (HOSPITAL_BASED_OUTPATIENT_CLINIC_OR_DEPARTMENT_OTHER): Payer: Medicare Other

## 2011-12-16 ENCOUNTER — Ambulatory Visit (HOSPITAL_COMMUNITY)
Admission: RE | Admit: 2011-12-16 | Discharge: 2011-12-16 | Disposition: A | Discharge status: Home / Self Care | Payer: Medicare Other | Source: Ambulatory Visit | Attending: Internal Medicine | Admitting: Internal Medicine

## 2011-12-16 DIAGNOSIS — J984 Other disorders of lung: Secondary | ICD-10-CM | POA: Insufficient documentation

## 2011-12-16 DIAGNOSIS — C3410 Malignant neoplasm of upper lobe, unspecified bronchus or lung: Secondary | ICD-10-CM

## 2011-12-16 DIAGNOSIS — C801 Malignant (primary) neoplasm, unspecified: Secondary | ICD-10-CM

## 2011-12-16 DIAGNOSIS — C7931 Secondary malignant neoplasm of brain: Secondary | ICD-10-CM

## 2011-12-16 DIAGNOSIS — C3490 Malignant neoplasm of unspecified part of unspecified bronchus or lung: Secondary | ICD-10-CM

## 2011-12-16 LAB — CMP (CANCER CENTER ONLY)
ALT(SGPT): 13 U/L (ref 10–47)
AST: 14 U/L (ref 11–38)
Albumin: 2.8 g/dL — ABNORMAL LOW (ref 3.3–5.5)
Alkaline Phosphatase: 45 U/L (ref 26–84)
Glucose, Bld: 128 mg/dL — ABNORMAL HIGH (ref 73–118)
Potassium: 3.3 mEq/L (ref 3.3–4.7)
Sodium: 144 mEq/L (ref 128–145)
Total Bilirubin: 0.6 mg/dl (ref 0.20–1.60)
Total Protein: 5.6 g/dL — ABNORMAL LOW (ref 6.4–8.1)

## 2011-12-16 LAB — CBC WITH DIFFERENTIAL/PLATELET
Eosinophils Absolute: 0.1 10*3/uL (ref 0.0–0.5)
MONO#: 0.7 10*3/uL (ref 0.1–0.9)
NEUT#: 6 10*3/uL (ref 1.5–6.5)
RBC: 3.47 10*6/uL — ABNORMAL LOW (ref 4.20–5.82)
RDW: 15.3 % — ABNORMAL HIGH (ref 11.0–14.6)
WBC: 7.9 10*3/uL (ref 4.0–10.3)
nRBC: 0 % (ref 0–0)

## 2011-12-16 MED ORDER — IOHEXOL 300 MG/ML  SOLN
80.0000 mL | Freq: Once | INTRAMUSCULAR | Status: AC | PRN
  Administered 2011-12-16: 80 mL via INTRAVENOUS

## 2011-12-17 ENCOUNTER — Encounter (INDEPENDENT_AMBULATORY_CARE_PROVIDER_SITE_OTHER): Payer: Medicare Other

## 2011-12-17 DIAGNOSIS — I714 Abdominal aortic aneurysm, without rupture: Secondary | ICD-10-CM

## 2011-12-17 DIAGNOSIS — I700 Atherosclerosis of aorta: Secondary | ICD-10-CM

## 2011-12-23 ENCOUNTER — Ambulatory Visit (HOSPITAL_BASED_OUTPATIENT_CLINIC_OR_DEPARTMENT_OTHER): Payer: Medicare Other | Admitting: Internal Medicine

## 2011-12-23 ENCOUNTER — Telehealth: Payer: Self-pay | Admitting: Medical Oncology

## 2011-12-23 VITALS — BP 103/58 | HR 86 | Temp 96.8°F | Ht 70.0 in | Wt 116.7 lb

## 2011-12-23 DIAGNOSIS — C801 Malignant (primary) neoplasm, unspecified: Secondary | ICD-10-CM

## 2011-12-23 DIAGNOSIS — R911 Solitary pulmonary nodule: Secondary | ICD-10-CM

## 2011-12-23 DIAGNOSIS — C3410 Malignant neoplasm of upper lobe, unspecified bronchus or lung: Secondary | ICD-10-CM

## 2011-12-23 DIAGNOSIS — C7931 Secondary malignant neoplasm of brain: Secondary | ICD-10-CM

## 2011-12-23 NOTE — Telephone Encounter (Signed)
Calling to schedule 3 month f/u . I told  Him someone will call him to schedule it.

## 2011-12-23 NOTE — Progress Notes (Signed)
[facility] [phone]   [phone]  OFFICE PROGRESS NOTE  Jaclyn Shaggy, MD, MD [address]   [place] [address]  DIAGNOSIS: Metastatic non-small cell lung cancer, adenocarcinoma, initially diagnosed as stage IB in July of 2011, now with metastatic brain lesion.   PRIOR THERAPY:  #1 Status post right upper lobectomy with lymph node dissection under the care of Dr. Dorris Fetch on 04/10/2010. The patient refused adjuvant chemotherapy.  #2 whole brain irradiation completed 10/29/2011  CURRENT THERAPY: None.   INTERVAL HISTORY: PHELAN SCHADT 77 y.o. male returns to the clinic today for three-month followup visit accompanied by his daughter. The patient tolerated his whole brain irradiation fairly well except for generalized fatigue and lack of appetite and weight loss. He denied having any significant chest pain or shortness of breath. He has few falls recently especially at nighttime. He is scheduled to have life path by his primary care physician to help him at home. He has repeat CT scan of the head and chest performed recently and he is here today for evaluation and discussion of his scan results.  MEDICAL HISTORY: Past Medical History  Diagnosis Date  . CAD (coronary artery disease)     -s/p Cypher drug eluting stent to LCX for unstable angina in 2006  . Renal artery stenosis   . HTN (hypertension)   . Hypercholesterolemia   . Renal insufficiency     baseline Cr 1.5-1.8  . PAD (peripheral artery disease)   . 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 Torticollis   . Opacity, cornea, central     RUL opacity on CXR 9/10 - refused CT  . Adenocarcinoma     Right upper lobe   . Coordination impairment     Right Parietal Mass  . Weakness 08/20/11    Brain Mets - Left Leg and Arm Left pronator drift  . Brain cancer      Mets - Right Parietal mass - Vasogenic Edema  . Foley catheter in place    Since Surgery 09/30/11    ALLERGIES:  is allergic to sulfonamide derivatives.  MEDICATIONS:  Current Outpatient Prescriptions  Medication Sig Dispense Refill  . aliskiren (TEKTURNA) 300 MG tablet Take 300 mg by mouth daily.       Marland Kitchen amLODipine (NORVASC) 10 MG tablet Take 10 mg by mouth daily.       . carvedilol (COREG) 25 MG tablet Take 25 mg by mouth 2 (two) times daily.        . cloNIDine (CATAPRES) 0.3 MG tablet Take 0.3 mg by mouth 2 (two) times daily.      . cyclobenzaprine (FLEXERIL) 10 MG tablet Take 10 mg by mouth 3 (three) times daily as needed. For muscle spasms      . docusate sodium (COLACE) 100 MG capsule Take 100 mg by mouth 2 (two) times daily.        Marland Kitchen doxazosin (CARDURA) 1 MG tablet Take 1 mg by mouth at bedtime.      . hydrochlorothiazide 25 MG tablet Take 25 mg by mouth daily.        Marland Kitchen LORazepam (ATIVAN) 0.5 MG tablet Take 0.5 mg by mouth at bedtime as needed. For anxiety      . pantoprazole (PROTONIX) 40 MG tablet Take 40 mg by mouth daily at 12 noon.      . potassium chloride (K-DUR) 10 MEQ  tablet Take 2 tablets (20 mEq total) by mouth 2 (two) times daily.  12 tablet  0  . simvastatin (ZOCOR) 40 MG tablet Take 40 mg by mouth at bedtime.        . Tamsulosin HCl (FLOMAX) 0.4 MG CAPS Take 0.4 mg by mouth daily.          SURGICAL HISTORY:  Past Surgical History  Procedure Date  . Coronary stent placement   . Bronchoscopy 04/10/2010    [facility]  . Rt vats, rt upper lobectomy, mediastinal lymph node dissection 04/10/2010    [facility]  . Left kidney stent placement   . Hemorroidectomy   . Craniotomy 09/30/2011    Procedure: CRANIOTOMY TUMOR EXCISION;  Surgeon: Mariam Dollar, MD;  Location: [facility];  Service: Neurosurgery;  Laterality: Right;  Right Frontal Parietal Stealth Craniotomy    REVIEW OF SYSTEMS:  A comprehensive review of systems was negative except for: Constitutional: positive for fatigue and weight loss Musculoskeletal: positive for muscle  weakness   PHYSICAL EXAMINATION: General appearance: alert, cooperative, fatigued and no distress Neck: no adenopathy Lymph nodes: Cervical, supraclavicular, and axillary nodes normal. Resp: clear to auscultation bilaterally Cardio: regular rate and rhythm, S1, S2 normal, no murmur, click, rub or gallop GI: soft, non-tender; bowel sounds normal; no masses,  no organomegaly Extremities: extremities normal, atraumatic, no cyanosis or edema  ECOG PERFORMANCE STATUS: 2 - Symptomatic, <50% confined to bed  Blood pressure 103/58, pulse 86, temperature 96.8 F (36 C), temperature source Oral, height 5\' 10"  (1.778 m), weight 116 lb 11.2 oz (52.935 kg).  LABORATORY DATA: Lab Results  Component Value Date   WBC 7.9 12/16/2011   HGB 10.9* 12/16/2011   HCT 32.7* 12/16/2011   MCV 94.4 12/16/2011   PLT 299 12/16/2011      Chemistry      Component Value Date/Time   NA 144 12/16/2011 1142   NA 142 11/17/2011 1117   K 3.3 12/16/2011 1142   K 3.2* 11/17/2011 1117   CL 103 12/16/2011 1142   CL 103 11/17/2011 1117   CO2 29 12/16/2011 1142   CO2 23 09/24/2011 1306   BUN 27* 12/16/2011 1142   BUN 25* 11/17/2011 1117   CREATININE 1.3* 12/16/2011 1142   CREATININE 1.20 11/17/2011 1117      Component Value Date/Time   CALCIUM 9.4 12/16/2011 1142   CALCIUM 9.8 09/24/2011 1306   ALKPHOS 45 12/16/2011 1142   ALKPHOS 37* 09/05/2011 1118   AST 14 12/16/2011 1142   AST 12 09/05/2011 1118   ALT 16 09/05/2011 1118   BILITOT 0.60 12/16/2011 1142   BILITOT 0.3 09/05/2011 1118       RADIOGRAPHIC STUDIES: Ct Head W Wo Contrast  12/16/2011  *RADIOLOGY REPORT*  Clinical Data: Cancer.  Restaging.  CT HEAD WITHOUT AND WITH CONTRAST  Technique:  Contiguous axial images were obtained from the base of the skull through the vertex without and with intravenous contrast.  Contrast: 80mL OMNIPAQUE IOHEXOL 300 MG/ML  SOLN  Comparison: MRI 09/30/2011.  MRI 08/21/2011.  Head CT 08/20/2011.  Findings: The study suffers from motion  degradation.  Multiple repeat attempts were carried out.  Lesion in the right parietal cortical and subcortical area is smaller, estimated at 12 mm in diameter, with some calcification. Adjacent white matter shows mild low density which could represent minimal edema or gliosis.  No mass effect or shift.  No second lesion is identified.  No hydrocephalus.  No evidence of ischemic infarction.  Changes of previous craniotomy are as expected. Maxillary sinuses are opacified.  Mastoid air cells are opacified.  IMPRESSION: Markedly degraded by motion.  Solitary brain abnormality in the right parietal cortical and subcortical brain measuring approximately 12 mm in size, with some internal calcification.  Mild adjacent low density in the white matter could be gliosis or mild edema.  The appearance represents an improvement since all prior imaging.  No worsening or new abnormality is seen.  Original Report Authenticated By: Thomasenia Sales, M.D.   Ct Chest W Contrast  12/16/2011  *RADIOLOGY REPORT*  Clinical Data: follow up lung cancer  CT CHEST WITH CONTRAST  Technique:  Multidetector CT imaging of the chest was performed following the standard protocol during bolus administration of intravenous contrast.  Contrast: 80mL OMNIPAQUE IOHEXOL 300 MG/ML  SOLN  Comparison: 09/10/2011  Findings: No enlarged axillary or supraclavicular lymph nodes.  There are no enlarged mediastinal or hilar lymph nodes.  No pericardial effusion.  Mild pleural thickening versus effusion is noted overlying the right posterior lung base.  There is a new pulmonary nodule in the right lower lobe which measures 8.4 mm, image 46.  Area of ground-glass attenuation within the left upper lobe is identified measuring approximately 1.8 cm, image 20.  This is new from prior exam.  1.3 cm left upper lobe ground-glass nodule is also stable from previous exam.  Right upper lobe nodule measures 4.2 mm, image 20.  Stable from previous exam.  Limited imaging  through the upper abdomen shows multiple bilateral renal cysts.  There is also evidence of common bile duct dilatation which measures up to 10 mm.  Gallstones are suspected but cannot be confirmed.  There may be a faint filling defect within the distal CBD.  Infrarenal abdominal aortic aneurysm is again identified. Incompletely visualized.  Review of the visualized osseous structures shows no aggressive bone lesions.  IMPRESSION: 1.  There is a new solid nodule within the right lower lobe which is suspicious for metastatic disease. 2. Previously described right upper lobe nodule and left upper lobe ground-glass densities are unchanged from previous exam. 3.  Increased caliber of the common bile duct.  Cannot rule out gallstones and/or choledocholithiasis.  If there is a clinical concern for bile duct obstruction suggest initial workup with right upper quadrant sonogram.  Original Report Authenticated By: Rosealee Albee, M.D.    ASSESSMENT: This is a pleasant 77 years old white male with metastatic non-small cell lung cancer recently with brain metastasis status post whole brain irradiation. The patient is doing fine except for generalized fatigue and weakness as well as weight loss secondary to poor appetite. His CT scan of the head and chest showed no significant evidence for disease progression except for a pulmonary nodule in the right lower lobe suspicious for metastatic disease.  PLAN: I discussed the scan results and showed the images to the patient and his daughter. I gave him the option of a referral to Dr. Basilio Cairo for consideration of stereotactic radiotherapy to the pulmonary nodule versus observation and repeat CT scan of the chest in 3 months. The patient decided to wait for see more months to evaluate this pulmonary nodule before considering any treatment as he is still very weak and recovering from the effects of his radiotherapy. I would see him back for followup visit in 3 months. He was  advised to call me immediately she has any concerning symptoms. All questions were answered. The patient knows to call the clinic with  any problems, questions or concerns. We can certainly see the patient much sooner if necessary.

## 2011-12-25 ENCOUNTER — Telehealth: Payer: Self-pay | Admitting: Internal Medicine

## 2011-12-25 ENCOUNTER — Encounter: Payer: Medicare Other | Admitting: Nutrition

## 2011-12-25 NOTE — Telephone Encounter (Signed)
s/w pt and pt is aware of 7/22 and 7/25 appts   aom

## 2011-12-27 ENCOUNTER — Telehealth: Payer: Self-pay

## 2011-12-27 MED ORDER — CLONIDINE HCL 0.3 MG PO TABS: 0.3000 mg | ORAL_TABLET | Freq: Two times a day (BID) | ORAL | Status: DC

## 2011-12-27 NOTE — Telephone Encounter (Signed)
The patient was notified he needs to make a follow up appointment with either Dr. Gala Romney in [place] or with Dr. Mariah Milling in the [facility] office so we can refill the clonidine that the pharmacy is requesting.The patient just had a brain tumor removed and will be going through radiation and will call the office back around Aug. 2013 to schedule an appointment.

## 2012-01-23 ENCOUNTER — Other Ambulatory Visit: Payer: Self-pay | Admitting: *Deleted

## 2012-01-23 MED ORDER — CLONIDINE HCL 0.3 MG PO TABS: 0.3000 mg | ORAL_TABLET | Freq: Two times a day (BID) | ORAL | Status: DC

## 2012-01-23 NOTE — Telephone Encounter (Signed)
Contacted pt to see how pt is doing, pt did not want to set up appointment today. Pt has decided to continue to follow up with Dr. Gala Romney. Refilled Clonidine.

## 2012-02-02 ENCOUNTER — Inpatient Hospital Stay (HOSPITAL_COMMUNITY)
Admission: EM | Admit: 2012-02-02 | Discharge: 2012-02-06 | DRG: 180 | Disposition: A | Discharge status: Skilled Nursing Facility | Payer: Medicare Other | Attending: Family Medicine | Admitting: Family Medicine

## 2012-02-02 ENCOUNTER — Encounter (HOSPITAL_COMMUNITY): Payer: Self-pay | Admitting: Emergency Medicine

## 2012-02-02 ENCOUNTER — Emergency Department (HOSPITAL_COMMUNITY): Payer: Medicare Other

## 2012-02-02 DIAGNOSIS — C3490 Malignant neoplasm of unspecified part of unspecified bronchus or lung: Principal | ICD-10-CM | POA: Diagnosis present

## 2012-02-02 DIAGNOSIS — Z85841 Personal history of malignant neoplasm of brain: Secondary | ICD-10-CM

## 2012-02-02 DIAGNOSIS — N289 Disorder of kidney and ureter, unspecified: Secondary | ICD-10-CM

## 2012-02-02 DIAGNOSIS — N189 Chronic kidney disease, unspecified: Secondary | ICD-10-CM | POA: Diagnosis present

## 2012-02-02 DIAGNOSIS — D631 Anemia in chronic kidney disease: Secondary | ICD-10-CM | POA: Diagnosis present

## 2012-02-02 DIAGNOSIS — C801 Malignant (primary) neoplasm, unspecified: Secondary | ICD-10-CM | POA: Diagnosis present

## 2012-02-02 DIAGNOSIS — R531 Weakness: Secondary | ICD-10-CM

## 2012-02-02 DIAGNOSIS — I6350 Cerebral infarction due to unspecified occlusion or stenosis of unspecified cerebral artery: Secondary | ICD-10-CM | POA: Diagnosis present

## 2012-02-02 DIAGNOSIS — I2510 Atherosclerotic heart disease of native coronary artery without angina pectoris: Secondary | ICD-10-CM | POA: Diagnosis present

## 2012-02-02 DIAGNOSIS — C7931 Secondary malignant neoplasm of brain: Secondary | ICD-10-CM | POA: Diagnosis present

## 2012-02-02 DIAGNOSIS — R5381 Other malaise: Secondary | ICD-10-CM | POA: Diagnosis present

## 2012-02-02 DIAGNOSIS — Z9181 History of falling: Secondary | ICD-10-CM

## 2012-02-02 DIAGNOSIS — I129 Hypertensive chronic kidney disease with stage 1 through stage 4 chronic kidney disease, or unspecified chronic kidney disease: Secondary | ICD-10-CM | POA: Diagnosis present

## 2012-02-02 DIAGNOSIS — E876 Hypokalemia: Secondary | ICD-10-CM | POA: Diagnosis present

## 2012-02-02 DIAGNOSIS — R339 Retention of urine, unspecified: Secondary | ICD-10-CM | POA: Diagnosis present

## 2012-02-02 DIAGNOSIS — R5383 Other fatigue: Secondary | ICD-10-CM | POA: Diagnosis present

## 2012-02-02 DIAGNOSIS — N179 Acute kidney failure, unspecified: Secondary | ICD-10-CM | POA: Diagnosis present

## 2012-02-02 DIAGNOSIS — F17200 Nicotine dependence, unspecified, uncomplicated: Secondary | ICD-10-CM | POA: Diagnosis present

## 2012-02-02 DIAGNOSIS — E785 Hyperlipidemia, unspecified: Secondary | ICD-10-CM | POA: Diagnosis present

## 2012-02-02 DIAGNOSIS — R29898 Other symptoms and signs involving the musculoskeletal system: Secondary | ICD-10-CM | POA: Diagnosis present

## 2012-02-02 LAB — AMMONIA: Ammonia: 15 umol/L (ref 11–60)

## 2012-02-02 LAB — DIFFERENTIAL
Basophils Relative: 0 % (ref 0–1)
Eosinophils Absolute: 0 10*3/uL (ref 0.0–0.7)
Eosinophils Relative: 0 % (ref 0–5)
Lymphs Abs: 1.2 10*3/uL (ref 0.7–4.0)
Neutrophils Relative %: 74 % (ref 43–77)

## 2012-02-02 LAB — CBC
MCH: 30 pg (ref 26.0–34.0)
MCHC: 34.8 g/dL (ref 30.0–36.0)
MCV: 86.3 fL (ref 78.0–100.0)
Platelets: 224 10*3/uL (ref 150–400)
RDW: 14.9 % (ref 11.5–15.5)

## 2012-02-02 LAB — COMPREHENSIVE METABOLIC PANEL
ALT: <5 U/L (ref 0–53)
Albumin: 3.1 g/dL — ABNORMAL LOW (ref 3.5–5.2)
Alkaline Phosphatase: 47 U/L (ref 39–117)
Calcium: 10.7 mg/dL — ABNORMAL HIGH (ref 8.4–10.5)
GFR calc Af Amer: 37 mL/min — ABNORMAL LOW (ref >90)
Glucose, Bld: 122 mg/dL — ABNORMAL HIGH (ref 70–99)
Potassium: 2.5 mEq/L — CL (ref 3.5–5.1)
Sodium: 146 mEq/L — ABNORMAL HIGH (ref 135–145)
Total Protein: 5.3 g/dL — ABNORMAL LOW (ref 6.0–8.3)

## 2012-02-02 LAB — URINALYSIS, ROUTINE W REFLEX MICROSCOPIC
Nitrite: NEGATIVE
Specific Gravity, Urine: 1.022 (ref 1.005–1.030)
Urobilinogen, UA: 1 mg/dL (ref 0.0–1.0)
pH: 5.5 (ref 5.0–8.0)

## 2012-02-02 LAB — URINE MICROSCOPIC-ADD ON

## 2012-02-02 LAB — POTASSIUM: Potassium: 2.6 mEq/L — CL (ref 3.5–5.1)

## 2012-02-02 MED ORDER — DEXTROSE 5 % IV SOLN
1.0000 g | INTRAVENOUS | Status: DC
  Administered 2012-02-02: 1 g via INTRAVENOUS
  Filled 2012-02-02: qty 10

## 2012-02-02 MED ORDER — SIMVASTATIN 40 MG PO TABS
40.0000 mg | ORAL_TABLET | Freq: Every day | ORAL | Status: DC
  Administered 2012-02-03: 40 mg via ORAL
  Filled 2012-02-02 (×2): qty 1

## 2012-02-02 MED ORDER — POTASSIUM CHLORIDE IN NACL 20-0.45 MEQ/L-% IV SOLN
INTRAVENOUS | Status: DC
  Administered 2012-02-03: 01:00:00 via INTRAVENOUS
  Filled 2012-02-02: qty 1000

## 2012-02-02 MED ORDER — CYCLOBENZAPRINE HCL 10 MG PO TABS
10.0000 mg | ORAL_TABLET | Freq: Three times a day (TID) | ORAL | Status: DC | PRN
  Filled 2012-02-02: qty 1

## 2012-02-02 MED ORDER — HYDROCHLOROTHIAZIDE 25 MG PO TABS
25.0000 mg | ORAL_TABLET | Freq: Every day | ORAL | Status: DC
  Administered 2012-02-03 – 2012-02-06 (×4): 25 mg via ORAL
  Filled 2012-02-02 (×4): qty 1

## 2012-02-02 MED ORDER — POTASSIUM CHLORIDE CRYS ER 20 MEQ PO TBCR
40.0000 meq | EXTENDED_RELEASE_TABLET | Freq: Four times a day (QID) | ORAL | Status: DC
  Administered 2012-02-03 (×2): 40 meq via ORAL
  Filled 2012-02-02 (×5): qty 2

## 2012-02-02 MED ORDER — ASPIRIN EC 81 MG PO TBEC
81.0000 mg | DELAYED_RELEASE_TABLET | Freq: Every day | ORAL | Status: DC
  Administered 2012-02-03 – 2012-02-06 (×4): 81 mg via ORAL
  Filled 2012-02-02 (×4): qty 1

## 2012-02-02 MED ORDER — ENOXAPARIN SODIUM 40 MG/0.4ML ~~LOC~~ SOLN
40.0000 mg | Freq: Every day | SUBCUTANEOUS | Status: DC
  Administered 2012-02-03 – 2012-02-05 (×4): 40 mg via SUBCUTANEOUS
  Filled 2012-02-02 (×5): qty 0.4

## 2012-02-02 MED ORDER — POTASSIUM CHLORIDE CRYS ER 20 MEQ PO TBCR
40.0000 meq | EXTENDED_RELEASE_TABLET | Freq: Once | ORAL | Status: AC
  Administered 2012-02-02: 40 meq via ORAL
  Filled 2012-02-02: qty 2

## 2012-02-02 MED ORDER — CARVEDILOL 25 MG PO TABS
25.0000 mg | ORAL_TABLET | Freq: Two times a day (BID) | ORAL | Status: DC
  Administered 2012-02-03 – 2012-02-06 (×8): 25 mg via ORAL
  Filled 2012-02-02 (×9): qty 1

## 2012-02-02 MED ORDER — ALISKIREN FUMARATE 150 MG PO TABS
300.0000 mg | ORAL_TABLET | Freq: Every day | ORAL | Status: DC
  Administered 2012-02-03 – 2012-02-06 (×4): 300 mg via ORAL
  Filled 2012-02-02 (×4): qty 2

## 2012-02-02 MED ORDER — LORAZEPAM 0.5 MG PO TABS: 0.5000 mg | ORAL_TABLET | Freq: Every evening | ORAL | Status: DC | PRN

## 2012-02-02 MED ORDER — SODIUM CHLORIDE 0.9 % IJ SOLN
3.0000 mL | Freq: Two times a day (BID) | INTRAMUSCULAR | Status: DC
  Administered 2012-02-03 – 2012-02-06 (×7): 3 mL via INTRAVENOUS

## 2012-02-02 MED ORDER — DOXAZOSIN MESYLATE 1 MG PO TABS
1.0000 mg | ORAL_TABLET | Freq: Every day | ORAL | Status: DC
  Administered 2012-02-03: 1 mg via ORAL
  Filled 2012-02-02 (×2): qty 1

## 2012-02-02 MED ORDER — AMLODIPINE BESYLATE 10 MG PO TABS
10.0000 mg | ORAL_TABLET | Freq: Every day | ORAL | Status: DC
  Administered 2012-02-03 – 2012-02-06 (×4): 10 mg via ORAL
  Filled 2012-02-02 (×4): qty 1

## 2012-02-02 MED ORDER — POTASSIUM CHLORIDE 10 MEQ/100ML IV SOLN
10.0000 meq | Freq: Once | INTRAVENOUS | Status: AC
  Administered 2012-02-02: 10 meq via INTRAVENOUS
  Filled 2012-02-02: qty 100

## 2012-02-02 MED ORDER — DOCUSATE SODIUM 100 MG PO CAPS
100.0000 mg | ORAL_CAPSULE | Freq: Two times a day (BID) | ORAL | Status: DC
  Administered 2012-02-03 – 2012-02-06 (×8): 100 mg via ORAL
  Filled 2012-02-02 (×9): qty 1

## 2012-02-02 MED ORDER — CLONIDINE HCL 0.3 MG PO TABS
0.3000 mg | ORAL_TABLET | Freq: Two times a day (BID) | ORAL | Status: DC
  Administered 2012-02-03 (×2): 0.3 mg via ORAL
  Filled 2012-02-02 (×3): qty 1

## 2012-02-02 MED ORDER — SODIUM CHLORIDE 0.9 % IV SOLN
Freq: Once | INTRAVENOUS | Status: AC
  Administered 2012-02-02: 17:00:00 via INTRAVENOUS

## 2012-02-02 MED ORDER — ACETAMINOPHEN 325 MG PO TABS
650.0000 mg | ORAL_TABLET | Freq: Four times a day (QID) | ORAL | Status: DC | PRN
Start: 2012-02-02 — End: 2012-02-06

## 2012-02-02 MED ORDER — PANTOPRAZOLE SODIUM 40 MG PO TBEC
40.0000 mg | DELAYED_RELEASE_TABLET | Freq: Every day | ORAL | Status: DC
  Administered 2012-02-03 – 2012-02-06 (×5): 40 mg via ORAL
  Filled 2012-02-02 (×5): qty 1

## 2012-02-02 MED ORDER — TAMSULOSIN HCL 0.4 MG PO CAPS
0.4000 mg | ORAL_CAPSULE | Freq: Every day | ORAL | Status: DC
Start: 2012-02-03 — End: 2012-02-03
  Administered 2012-02-03: 0.4 mg via ORAL
  Filled 2012-02-02: qty 1

## 2012-02-02 MED ORDER — ACETAMINOPHEN 650 MG RE SUPP: 650.0000 mg | Freq: Four times a day (QID) | RECTAL | Status: DC | PRN

## 2012-02-02 NOTE — ED Notes (Signed)
REPORT GIVEN TO FLOOR NURSE , TRANSPORTED IN STABLE CONDITION  , NO PAIN , RESPIRATIONS UNLABORED.

## 2012-02-02 NOTE — ED Provider Notes (Signed)
History     CSN: [id number]  Arrival date & time 02/02/12  1514   First MD Initiated Contact with Patient 02/02/12 1550      Chief Complaint  Patient presents with  . Weakness  . Fall    (Consider location/radiation/quality/duration/timing/severity/associated sxs/prior treatment) Patient is a 77 y.o. male presenting with weakness and fall. The history is provided by the patient and a relative.  Weakness  Additional symptoms include weakness.  Fall  He is being treated for metastatic lung cancer with a known brain metastasis. He had been doing reasonably well until 2 days ago when he fell. His daughter noted that he was somewhat confused yesterday. Today he was weaker than he had been over the last 2 days and fell again. He denies any head injury. He is more confused today than he was yesterday. He denies fever, chills, sweats. He denies chest pain, nausea, vomiting, diarrhea.  Past Medical History  Diagnosis Date  . CAD (coronary artery disease)     -s/p Cypher drug eluting stent to LCX for unstable angina in 2006  . Renal artery stenosis   . HTN (hypertension)   . Hypercholesterolemia   . Renal insufficiency     baseline Cr 1.5-1.8  . PAD (peripheral artery disease)   . 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 Torticollis   . Opacity, cornea, central     RUL opacity on CXR 9/10 - refused CT  . Adenocarcinoma     Right upper lobe   . Coordination impairment     Right Parietal Mass  . Weakness 08/20/11    Brain Mets - Left Leg and Arm Left pronator drift  . Brain cancer      Mets - Right Parietal mass - Vasogenic Edema  . Foley catheter in place     Since Surgery 09/30/11    Past Surgical History  Procedure Date  . Coronary stent placement   . Bronchoscopy 04/10/2010    [facility]  . Rt vats, rt upper lobectomy, mediastinal lymph node dissection 04/10/2010    [facility]  . Left kidney stent placement   . Hemorroidectomy   .  Craniotomy 09/30/2011    Procedure: CRANIOTOMY TUMOR EXCISION;  Surgeon: Mariam Dollar, MD;  Location: [facility];  Service: Neurosurgery;  Laterality: Right;  Right Frontal Parietal Stealth Craniotomy    Family History  Problem Relation Age of Onset  . Breast cancer Mother   . Cancer Mother     Breast Cancer  . Peripheral vascular disease Sister     requiring on amputation  . Aneurysm Father   . Cancer Maternal Aunt     Breast Cancer    History  Substance Use Topics  . Smoking status: Current Everyday Smoker -- 0.5 packs/day for 56 years    Types: Cigarettes  . Smokeless tobacco: Not on file   Comment: Smoked 4-5 cigs per day, as he has for more than 40 years.  10/09/11 Continues to smoke ~ 2 cigarettes a day since his surgery on 09/30/11  . Alcohol Use: No     Hx of Alcohol Abuse      Review of Systems  Neurological: Positive for weakness.  All other systems reviewed and are negative.    Allergies  Sulfonamide derivatives  Home Medications   Current Outpatient Rx  Name Route Sig Dispense Refill  . ALISKIREN FUMARATE 300 MG PO TABS Oral Take 300 mg by mouth  daily.     Marland Kitchen AMLODIPINE BESYLATE 10 MG PO TABS Oral Take 10 mg by mouth daily.     Marland Kitchen CARVEDILOL 25 MG PO TABS Oral Take 25 mg by mouth 2 (two) times daily.      Marland Kitchen CLONIDINE HCL 0.3 MG PO TABS Oral Take 0.3 mg by mouth 2 (two) times daily.    . CYCLOBENZAPRINE HCL 10 MG PO TABS Oral Take 10 mg by mouth 3 (three) times daily as needed. For muscle spasms    . DOCUSATE SODIUM 100 MG PO CAPS Oral Take 100 mg by mouth 2 (two) times daily.      Marland Kitchen DOXAZOSIN MESYLATE 1 MG PO TABS Oral Take 1 mg by mouth at bedtime.    Marland Kitchen HYDROCHLOROTHIAZIDE 25 MG PO TABS Oral Take 25 mg by mouth daily.      Marland Kitchen LORAZEPAM 0.5 MG PO TABS Oral Take 0.5 mg by mouth at bedtime as needed. For anxiety    . PANTOPRAZOLE SODIUM 40 MG PO TBEC Oral Take 40 mg by mouth daily at 12 noon.    Marland Kitchen POTASSIUM CHLORIDE ER 10 MEQ PO TBCR Oral Take 20 mEq by mouth 2  (two) times daily.    Marland Kitchen SIMVASTATIN 40 MG PO TABS Oral Take 40 mg by mouth at bedtime.      . TAMSULOSIN HCL 0.4 MG PO CAPS Oral Take 0.4 mg by mouth daily.        BP 131/72  Pulse 89  Temp(Src) 98 F (36.7 C) (Oral)  Resp 16  SpO2 99%  Physical Exam  Nursing note and vitals reviewed.  76-cranial nerves are grossly intact. There are no gross motor deficits.-old male who is markedly cachectic but in no acute distress. Vital signs are normal. Oxygen saturation is 99% which is normal. Head is normocephalic and atraumatic. Right eye is prosthetic. Left pupil is round and reacts to light. Fundus is unremarkable. There is no facial asymmetry. Neck is nontender and supple without adenopathy or bruit. Back is nontender. Lungs are clear without rales, wheezes, or rhonchi. Heart has regular rate and rhythm without murmur. Abdomen is soft, flat, nontender without masses or hepatosplenomegaly. Extremities are cachectic and there is 2+ pedal edema present. Full range of motion is present. Skin is warm and dry without rash. Neurologic: He is awake and alert and oriented to person and place. He is not oriented to time and that he thinks it is January but he does know the  ED Course  Procedures (including critical care time)  Results for orders placed during the hospital encounter of 02/02/12  CBC      Component Value Range   WBC 7.4  4.0 - 10.5 (K/uL)   RBC 3.43 (*) 4.22 - 5.81 (MIL/uL)   Hemoglobin 10.3 (*) 13.0 - 17.0 (g/dL)   HCT 40.9 (*) 81.1 - 52.0 (%)   MCV 86.3  78.0 - 100.0 (fL)   MCH 30.0  26.0 - 34.0 (pg)   MCHC 34.8  30.0 - 36.0 (g/dL)   RDW 91.4  78.2 - 95.6 (%)   Platelets 224  150 - 400 (K/uL)  DIFFERENTIAL      Component Value Range   Neutrophils Relative 74  43 - 77 (%)   Neutro Abs 5.5  1.7 - 7.7 (K/uL)   Lymphocytes Relative 16  12 - 46 (%)   Lymphs Abs 1.2  0.7 - 4.0 (K/uL)   Monocytes Relative 9  3 - 12 (%)   Monocytes Absolute  0.7  0.1 - 1.0 (K/uL)   Eosinophils Relative  0  0 - 5 (%)   Eosinophils Absolute 0.0  0.0 - 0.7 (K/uL)   Basophils Relative 0  0 - 1 (%)   Basophils Absolute 0.0  0.0 - 0.1 (K/uL)  COMPREHENSIVE METABOLIC PANEL      Component Value Range   Sodium 146 (*) 135 - 145 (mEq/L)   Potassium 2.5 (*) 3.5 - 5.1 (mEq/L)   Chloride 108  96 - 112 (mEq/L)   CO2 27  19 - 32 (mEq/L)   Glucose, Bld 122 (*) 70 - 99 (mg/dL)   BUN 27 (*) 6 - 23 (mg/dL)   Creatinine, Ser 1.61 (*) 0.50 - 1.35 (mg/dL)   Calcium 09.6 (*) 8.4 - 10.5 (mg/dL)   Total Protein 5.3 (*) 6.0 - 8.3 (g/dL)   Albumin 3.1 (*) 3.5 - 5.2 (g/dL)   AST 8  0 - 37 (U/L)   ALT <5  0 - 53 (U/L)   Alkaline Phosphatase 47  39 - 117 (U/L)   Total Bilirubin 0.3  0.3 - 1.2 (mg/dL)   GFR calc non Af Amer 32 (*) >90 (mL/min)   GFR calc Af Amer 37 (*) >90 (mL/min)  AMMONIA      Component Value Range   Ammonia 15  11 - 60 (umol/L)  URINALYSIS, ROUTINE W REFLEX MICROSCOPIC      Component Value Range   Color, Urine AMBER (*) YELLOW    APPearance CLOUDY (*) CLEAR    Specific Gravity, Urine 1.022  1.005 - 1.030    pH 5.5  5.0 - 8.0    Glucose, UA NEGATIVE  NEGATIVE (mg/dL)   Hgb urine dipstick SMALL (*) NEGATIVE    Bilirubin Urine SMALL (*) NEGATIVE    Ketones, ur NEGATIVE  NEGATIVE (mg/dL)   Protein, ur 30 (*) NEGATIVE (mg/dL)   Urobilinogen, UA 1.0  0.0 - 1.0 (mg/dL)   Nitrite NEGATIVE  NEGATIVE    Leukocytes, UA MODERATE (*) NEGATIVE   URINE MICROSCOPIC-ADD ON      Component Value Range   Squamous Epithelial / LPF FEW (*) RARE    WBC, UA 21-50  <3 (WBC/hpf)   RBC / HPF 3-6  <3 (RBC/hpf)   Casts GRANULAR CAST (*) NEGATIVE    Sperm, UA PRESENT    URINE CULTURE      Component Value Range   Specimen Description URINE, CLEAN CATCH     Special Requests NONE     Culture  Setup Time [id number]     Colony Count NO GROWTH     Culture NO GROWTH     Report Status 02/04/2012 FINAL    POTASSIUM      Component Value Range   Potassium 2.6 (*) 3.5 - 5.1 (mEq/L)  BASIC METABOLIC PANEL       Component Value Range   Sodium 143  135 - 145 (mEq/L)   Potassium 3.7  3.5 - 5.1 (mEq/L)   Chloride 111  96 - 112 (mEq/L)   CO2 22  19 - 32 (mEq/L)   Glucose, Bld 90  70 - 99 (mg/dL)   BUN 24 (*) 6 - 23 (mg/dL)   Creatinine, Ser 7.82 (*) 0.50 - 1.35 (mg/dL)   Calcium 9.6  8.4 - 95.6 (mg/dL)   GFR calc non Af Amer 40 (*) >90 (mL/min)   GFR calc Af Amer 46 (*) >90 (mL/min)  CBC      Component Value Range   WBC 7.3  4.0 - 10.5 (K/uL)   RBC 3.22 (*) 4.22 - 5.81 (MIL/uL)   Hemoglobin 9.5 (*) 13.0 - 17.0 (g/dL)   HCT 09.8 (*) 11.9 - 52.0 (%)   MCV 85.7  78.0 - 100.0 (fL)   MCH 29.5  26.0 - 34.0 (pg)   MCHC 34.4  30.0 - 36.0 (g/dL)   RDW 14.7  82.9 - 56.2 (%)   Platelets 213  150 - 400 (K/uL)  BASIC METABOLIC PANEL      Component Value Range   Sodium 145  135 - 145 (mEq/L)   Potassium 3.9  3.5 - 5.1 (mEq/L)   Chloride 113 (*) 96 - 112 (mEq/L)   CO2 24  19 - 32 (mEq/L)   Glucose, Bld 123 (*) 70 - 99 (mg/dL)   BUN 24 (*) 6 - 23 (mg/dL)   Creatinine, Ser 1.30 (*) 0.50 - 1.35 (mg/dL)   Calcium 9.8  8.4 - 86.5 (mg/dL)   GFR calc non Af Amer 39 (*) >90 (mL/min)   GFR calc Af Amer 46 (*) >90 (mL/min)  BASIC METABOLIC PANEL      Component Value Range   Sodium 144  135 - 145 (mEq/L)   Potassium 3.9  3.5 - 5.1 (mEq/L)   Chloride 112  96 - 112 (mEq/L)   CO2 17 (*) 19 - 32 (mEq/L)   Glucose, Bld 103 (*) 70 - 99 (mg/dL)   BUN 22  6 - 23 (mg/dL)   Creatinine, Ser 7.84 (*) 0.50 - 1.35 (mg/dL)   Calcium 9.6  8.4 - 69.6 (mg/dL)   GFR calc non Af Amer 42 (*) >90 (mL/min)   GFR calc Af Amer 49 (*) >90 (mL/min)  CBC      Component Value Range   WBC 6.1  4.0 - 10.5 (K/uL)   RBC 3.35 (*) 4.22 - 5.81 (MIL/uL)   Hemoglobin 10.0 (*) 13.0 - 17.0 (g/dL)   HCT 29.5 (*) 28.4 - 52.0 (%)   MCV 87.8  78.0 - 100.0 (fL)   MCH 29.9  26.0 - 34.0 (pg)   MCHC 34.0  30.0 - 36.0 (g/dL)   RDW 13.2  44.0 - 10.2 (%)   Platelets 211  150 - 400 (K/uL)  BASIC METABOLIC PANEL      Component Value Range    Sodium 142  135 - 145 (mEq/L)   Potassium 4.2  3.5 - 5.1 (mEq/L)   Chloride 112  96 - 112 (mEq/L)   CO2 23  19 - 32 (mEq/L)   Glucose, Bld 98  70 - 99 (mg/dL)   BUN 22  6 - 23 (mg/dL)   Creatinine, Ser 7.25 (*) 0.50 - 1.35 (mg/dL)   Calcium 36.6  8.4 - 10.5 (mg/dL)   GFR calc non Af Amer 47 (*) >90 (mL/min)   GFR calc Af Amer 55 (*) >90 (mL/min)   Ct Head Wo Contrast  02/02/2012  *RADIOLOGY REPORT*  Clinical Data: Weakness.  History of fall.  CT HEAD WITHOUT CONTRAST  Technique:  Contiguous axial images were obtained from the base of the skull through the vertex without contrast.  Comparison: Head CT 12/16/2011.  Findings: Study is severely limited by extensive patient motion. Despite these limitations, there appears to be a new area of low attenuation in the right corona radiata (immediately lateral to the head of the right caudate nucleus), best demonstrated on image 21 of series 3.  This may represent an area of subacute ischemia. Previously noted lesion in the high right parietal  cortex is again noted (image 38 of series 3) because of the associated calcifications, however, this is poorly depicted because of the gross patient motion.  A background of mild cerebral atrophy and chronic microvascular ischemic changes in the cerebral white matter bilaterally is similar to the prior examination.  Old lacunar infarction in the left putamen is unchanged. Postoperative changes of a right parietal craniotomy is again noted.  Bilateral mastoid effusions and extensive opacification of the paranasal sinuses is again noted (slight improved aeration in the left maxillary sinus compared to the prior).  IMPRESSION: 1.  Interval development of a small area of low attenuation in the in the right corona radiata, suspicious for an area of subacute ischemia. Otherwise, the appearance of brain is similar to the prior examination, as detailed above (within the limitations of this exam which is compromised by extensive  patient motion). 2.  Bilateral mastoid effusions and extensive paranasal sinus disease redemonstrated.,  Original Report Authenticated By: Florencia Reasons, M.D.   Dg Chest Portable 1 View  02/02/2012  *RADIOLOGY REPORT*  Clinical Data: Weakness.  History of lung cancer.  PORTABLE CHEST - 1 VIEW  Comparison: PET CT scan 09/10/2011 and PA and lateral chest 08/20/2011.  Findings: The lungs are emphysematous but appear clear.  Heart size is normal.  No pneumothorax or pleural fluid.  IMPRESSION: No acute disease.  Original Report Authenticated By: Bernadene Bell. D'ALESSIO, M.D.      1. Weakness   2. Hypokalemia   3. Renal insufficiency   4. Lung cancer       MDM  Weakness and confusion and patient with known metastatic lung cancer. I reviewed his old records and he does have a known brain metastasis. CT will be obtained to evaluate for possible occult brain trauma from recent falls and to evaluate whether he might have progression of brain metastases. Urinalysis will be evaluated for possible UTI and electrolyte panel and CBC will be checked.  She was noted to be very low and he is given IV and oral potassium. Case is discussed with family practice Center and arrangements are made to admit the patient. Renal insufficiency is noted but unlikely to be weakness. There is suggestion on CT scan of possible stroke although this could also be a metastatic lesion.        Dione Booze, MD 02/06/12 [phone]

## 2012-02-02 NOTE — ED Notes (Signed)
EDP notified of 2.6 potassium

## 2012-02-02 NOTE — H&P (Signed)
Family Medicine Teaching [facility] Admission History and Physical  Patient name: [name] Medical record number: [id number] Date of birth: 26-Nov-1934 Age: 77 y.o. Gender: male  Primary Care Provider: Jaclyn Shaggy, MD, MD  Chief Complaint: leg weakness History of Present Illness: [name] is a 77 y.o. year old male with stage 4 lung cancer s/p lobectomy and craniotomy, CAD, PAD, HTN with renal artery stenosis s/p stent presenting with progressive lower extremity weakness and falls.  Patient attributes falls to lower extremity weakness; does say he gets lightheaded prior to falls.  States he has had 4-5 falls over the last several weeks.  Denies chest pain, shortness of breath, cough, abdominal pain, difficulty urinating, dysuria.  Reports eating okay at home.  Did have a urinary catheter for 5 months that was removed last week.  He lives alone with family members checking on him daily.  Patient was adamant about going home; his daughter assisted admitting team with convincing him to stay overnight.  Daughter reports that he is in denial about the severity of his prognosis and is still a full code.  Patient Active Problem List  Diagnoses  . HYPERLIPIDEMIA TYPE IIB / III  . HYPERLIPIDEMIA-MIXED  . TOBACCO ABUSE  . HYPERTENSION, BENIGN  . CAD, NATIVE VESSEL  . ABDOMINAL AORTIC ANEURYSM  . Nonspecific (abnormal) findings on radiological and other examination of body structure  . Nonspecific (abnormal) findings on radiological and other examination of skull and head  . Invasive denocarcinoma pT2a N0 Mx, Right Upper lobe  . Brain metastasis   Past Medical History: Past Medical History  Diagnosis Date  . CAD (coronary artery disease)     -s/p Cypher drug eluting stent to LCX for unstable angina in 2006  . Renal artery stenosis   . HTN (hypertension)   . Hypercholesterolemia   . Renal insufficiency     baseline Cr 1.5-1.8  . PAD (peripheral artery disease)   . History  of enucleation of right eyeball     after trauma as a child  . Torticollis   . Opacity, cornea, central     RUL opacity on CXR 9/10 - refused CT  . Adenocarcinoma     Right upper lobe   . Coordination impairment     Right Parietal Mass  . Weakness 08/20/11    Brain Mets - Left Leg and Arm Left pronator drift  . Brain cancer      Mets - Right Parietal mass - Vasogenic Edema  . Foley catheter in place     Since Surgery 09/30/11    Past Surgical History: Past Surgical History  Procedure Date  . Coronary stent placement   . Bronchoscopy 04/10/2010    [place]  . Rt vats, rt upper lobectomy, mediastinal lymph node dissection 04/10/2010    [facility]  . Left kidney stent placement   . Hemorroidectomy   . Craniotomy 09/30/2011    Procedure: CRANIOTOMY TUMOR EXCISION;  Surgeon: Mariam Dollar, MD;  Location: [facility];  Service: Neurosurgery;  Laterality: Right;  Right Frontal Parietal Stealth Craniotomy    Social History: History   Social History  . Marital Status: Widowed    Spouse Name: N/A    Number of Children: 2  . Years of Education: N/A   Occupational History  .     Social History Main Topics  . Smoking status: Current Everyday Smoker -- 0.5 packs/day for 56 years    Types: Cigarettes  . Smokeless tobacco: None  Comment: Smoked 4-5 cigs per day, as he has for more than 40 years.  10/09/11 Continues to smoke ~ 2 cigarettes a day since his surgery on 09/30/11  . Alcohol Use: No     Hx of Alcohol Abuse  . Drug Use: No  . Sexually Active:    Other Topics Concern  . None   Social History Narrative  . None    Family History: Family History  Problem Relation Age of Onset  . Breast cancer Mother   . Cancer Mother     Breast Cancer  . Peripheral vascular disease Sister     requiring on amputation  . Aneurysm Father   . Cancer Maternal Aunt     Breast Cancer    Allergies: Allergies  Allergen Reactions  . Sulfonamide Derivatives Hives    Current  Facility-Administered Medications  Medication Dose Route Frequency Provider Last Rate Last Dose  . 0.9 %  sodium chloride infusion   Intravenous Once Dione Booze, MD 125 mL/hr at 02/02/12 1636    . cefTRIAXone (ROCEPHIN) 1 g in dextrose 5 % 50 mL IVPB  1 g Intravenous Q24H Dione Booze, MD   1 g at 02/02/12 1954  . potassium chloride 10 mEq in 100 mL IVPB  10 mEq Intravenous Once Dione Booze, MD   10 mEq at 02/02/12 1840  . potassium chloride SA (K-DUR,KLOR-CON) CR tablet 40 mEq  40 mEq Oral Once Dione Booze, MD   40 mEq at 02/02/12 1841   Current Outpatient Prescriptions  Medication Sig Dispense Refill  . aliskiren (TEKTURNA) 300 MG tablet Take 300 mg by mouth daily.       Marland Kitchen amLODipine (NORVASC) 10 MG tablet Take 10 mg by mouth daily.       . carvedilol (COREG) 25 MG tablet Take 25 mg by mouth 2 (two) times daily.        . cloNIDine (CATAPRES) 0.3 MG tablet Take 0.3 mg by mouth 2 (two) times daily.      . cyclobenzaprine (FLEXERIL) 10 MG tablet Take 10 mg by mouth 3 (three) times daily as needed. For muscle spasms      . docusate sodium (COLACE) 100 MG capsule Take 100 mg by mouth 2 (two) times daily.        Marland Kitchen doxazosin (CARDURA) 1 MG tablet Take 1 mg by mouth at bedtime.      . hydrochlorothiazide 25 MG tablet Take 25 mg by mouth daily.        Marland Kitchen LORazepam (ATIVAN) 0.5 MG tablet Take 0.5 mg by mouth at bedtime as needed. For anxiety      . pantoprazole (PROTONIX) 40 MG tablet Take 40 mg by mouth daily at 12 noon.      . potassium chloride (K-DUR) 10 MEQ tablet Take 20 mEq by mouth 2 (two) times daily.      . simvastatin (ZOCOR) 40 MG tablet Take 40 mg by mouth at bedtime.        . Tamsulosin HCl (FLOMAX) 0.4 MG CAPS Take 0.4 mg by mouth daily.        Marland Kitchen DISCONTD: cloNIDine (CATAPRES) 0.3 MG tablet Take 1 tablet (0.3 mg total) by mouth 2 (two) times daily.  60 tablet  0  . DISCONTD: potassium chloride (K-DUR) 10 MEQ tablet Take 2 tablets (20 mEq total) by mouth 2 (two) times daily.  12 tablet  0    Review Of Systems: Per HPI  Otherwise 12 point review of systems was performed and  was unremarkable.  Physical Exam: Pulse: 74  Blood Pressure: 187/80 RR: 18   O2: 100 on RA Temp: 98.3  General: alert, appears older than stated age, no distress and limited cooperation with history and exam partly due to hearing loss; very thin HEENT: PERRLA, extra ocular movement intact (left eye only; right eye s/p enucleation), sclera clear, anicteric, oropharynx clear, no lesions, neck supple with midline trachea and no LAD, slightly dry mucous membranes Heart: S1, S2 normal, no murmur, rub or gallop, regular rate and rhythm Lungs: clear to auscultation, no wheezes or rales and unlabored breathing Abdomen: abdomen is soft without significant tenderness, masses, organomegaly or guarding Extremities: extremities normal, atraumatic, no cyanosis or edema Skin: bruises on arms Neurology: Patient with torticollis of the neck with leftward head turn and forward flexion.  His left arm is slightly weak.  Reflexes are intact.  Sensation is intact.    The patient attempted to ambulate. He was able to stand up under his own power was very unsteady on his feet and use the wall to assist his ambulation.  Labs and Imaging: Lab Results  Component Value Date/Time   NA 146* 02/02/2012  4:05 PM   NA 144 12/16/2011 11:42 AM   K 2.6* 02/02/2012  8:02 PM   K 3.3 12/16/2011 11:42 AM   CL 108 02/02/2012  4:05 PM   CL 103 12/16/2011 11:42 AM   CO2 27 02/02/2012  4:05 PM   CO2 29 12/16/2011 11:42 AM   BUN 27* 02/02/2012  4:05 PM   BUN 27* 12/16/2011 11:42 AM   CREATININE 1.94* 02/02/2012  4:05 PM   CREATININE 1.3* 12/16/2011 11:42 AM   GLUCOSE 122* 02/02/2012  4:05 PM   GLUCOSE 128* 12/16/2011 11:42 AM   Lab Results  Component Value Date   WBC 7.4 02/02/2012   HGB 10.3* 02/02/2012   HCT 29.6* 02/02/2012   MCV 86.3 02/02/2012   PLT 224 02/02/2012   Ammonia 15  UA: spec grav 1.022, small bilirubin, 30 protein, moderate LE, granular  casts  CXR: No acute disease.  Head CT:  1. Interval development of a small area of low attenuation in the  in the right corona radiata, suspicious for an area of subacute  ischemia. Otherwise, the appearance of brain is similar to the  prior examination, as detailed above (within the limitations of  this exam which is compromised by extensive patient motion).  2. Bilateral mastoid effusions and extensive paranasal sinus  disease redemonstrated.,   Assessment and Plan: BLAYDEN CONWELL is a 77 y.o. year old male presenting with weakness and falls. 1. weakness and falls:  I believe this to be multifactorial.  It may be secondary to stroke versus, general deconditioning secondary to malignancy, versus new brain malignancy not imaged on head CT.   Regardless I feel he would benefit from admission with evaluation by physical therapy.  He may need short-term rehabilitation stay versus intensive home health.   Plan to followup in the morning  2. Hypokalemia: Patient significantly potassium depleted.  This may be secondary to nutritional status versus effect of malignancy.  Plan to replete potassium today and tonight and check magnesium in the morning.    3. lung cancer: Currently stage IV with metastatic disease.  Currently stable. May be candidate for palliative radiation therapy. I believe his functional status may be too poor for chemotherapy at this point. Would recommend followup with oncology upon discharge. If stay becomes extensive we'll consult oncology.  4. Hypertension: On multiple medications. Plan to continue all medications and follow. We'll use hydralazine as needed.  Plan for permissive hypertension.   5. Social: Perhaps the most concerning issue today.  Mr. Neidig likely will require  24-hour supervision but his family will not be able to do that.  Plan for social work consult along with physical therapy consult.  May benefit from hospice services.  6. acute on chronic renal  failure: Patient has a mild increase of creatinine.  This is likely secondary to mild dehydration with renin angiotensin pathway blockage.  Plan to use fluid rehydration and follow.  7. FEN/GI: Plan half normal saline with potassium at 50 mL per hour  8. Disposition: Pending workup

## 2012-02-02 NOTE — ED Notes (Signed)
C/o generalized weakness, pain all over, dizziness, and confusion since this morning per pt's daughter.  Reported fall x 2 today.  Pt lives by himself (unsure LSN).  Daughter reports that pt was "a little weak" yesterday.  Pt hard of hearing (speak loudly in pt's left ear).  Pt alert and oriented to person and place.  Does know year just not month.

## 2012-02-02 NOTE — ED Notes (Signed)
PT. CONFUSED AND DISORIENTED , DENIES PAIN , RESPIRATIONS UNLABORED . IV INTACT , PT. WAITING FOR ADMITTING MD.

## 2012-02-02 NOTE — ED Notes (Signed)
ALERT AND ORIENTED , SPEECH CLEAR , NO FACIAL ASYMMETRY . EQUAL STRONG GRIPS , NO ARM OR LEG DRIFT.

## 2012-02-03 ENCOUNTER — Telehealth: Payer: Self-pay | Admitting: Medical Oncology

## 2012-02-03 DIAGNOSIS — R5381 Other malaise: Secondary | ICD-10-CM

## 2012-02-03 DIAGNOSIS — R5383 Other fatigue: Secondary | ICD-10-CM

## 2012-02-03 DIAGNOSIS — R627 Adult failure to thrive: Secondary | ICD-10-CM

## 2012-02-03 DIAGNOSIS — F29 Unspecified psychosis not due to a substance or known physiological condition: Secondary | ICD-10-CM

## 2012-02-03 DIAGNOSIS — E876 Hypokalemia: Secondary | ICD-10-CM

## 2012-02-03 DIAGNOSIS — C3490 Malignant neoplasm of unspecified part of unspecified bronchus or lung: Secondary | ICD-10-CM

## 2012-02-03 DIAGNOSIS — N139 Obstructive and reflux uropathy, unspecified: Secondary | ICD-10-CM

## 2012-02-03 LAB — CBC
HCT: 27.6 % — ABNORMAL LOW (ref 39.0–52.0)
MCV: 85.7 fL (ref 78.0–100.0)
RBC: 3.22 MIL/uL — ABNORMAL LOW (ref 4.22–5.81)
RDW: 15 % (ref 11.5–15.5)
WBC: 7.3 10*3/uL (ref 4.0–10.5)

## 2012-02-03 LAB — BASIC METABOLIC PANEL
CO2: 22 mEq/L (ref 19–32)
Calcium: 9.8 mg/dL (ref 8.4–10.5)
Chloride: 111 mEq/L (ref 96–112)
Creatinine, Ser: 1.63 mg/dL — ABNORMAL HIGH (ref 0.50–1.35)
Creatinine, Ser: 1.65 mg/dL — ABNORMAL HIGH (ref 0.50–1.35)
GFR calc Af Amer: 46 mL/min — ABNORMAL LOW (ref >90)
GFR calc non Af Amer: 39 mL/min — ABNORMAL LOW (ref >90)

## 2012-02-03 MED ORDER — POTASSIUM CHLORIDE CRYS ER 20 MEQ PO TBCR
40.0000 meq | EXTENDED_RELEASE_TABLET | Freq: Two times a day (BID) | ORAL | Status: DC
  Administered 2012-02-03 – 2012-02-05 (×4): 40 meq via ORAL
  Filled 2012-02-03 (×4): qty 2

## 2012-02-03 MED ORDER — TAMSULOSIN HCL 0.4 MG PO CAPS
0.8000 mg | ORAL_CAPSULE | Freq: Every day | ORAL | Status: DC
  Filled 2012-02-03: qty 2

## 2012-02-03 MED ORDER — CLONIDINE HCL 0.2 MG PO TABS
0.2000 mg | ORAL_TABLET | Freq: Two times a day (BID) | ORAL | Status: DC
  Administered 2012-02-03 – 2012-02-04 (×3): 0.2 mg via ORAL
  Filled 2012-02-03 (×5): qty 1

## 2012-02-03 MED ORDER — ATORVASTATIN CALCIUM 20 MG PO TABS
20.0000 mg | ORAL_TABLET | Freq: Every day | ORAL | Status: DC
  Administered 2012-02-03 – 2012-02-05 (×3): 20 mg via ORAL
  Filled 2012-02-03 (×4): qty 1

## 2012-02-03 NOTE — Progress Notes (Signed)
FMTS Daily Intern Progress Note  Subjective: Patient asking about going home this morning.  States legs feel stronger this morning.  No CP, SOB, abd pain.  Eating okay.  I have reviewed the patient's medications.  Objective Temp:  [97.5 F (36.4 C)-98.5 F (36.9 C)] 98.5 F (36.9 C) (06/03 0500) Pulse Rate:  [69-98] 71  (06/03 0500) Resp:  [14-18] 18  (06/03 0500) BP: (100-195)/(65-87) 153/87 mmHg (06/03 0500) SpO2:  [97 %-100 %] 100 % (06/03 0500) Weight:  [119 lb 14.9 oz (54.4 kg)] 119 lb 14.9 oz (54.4 kg) (06/02 2306)   Intake/Output Summary (Last 24 hours) at 02/03/12 0945 Last data filed at 02/03/12 0500  Gross per 24 hour  Intake      0 ml  Output    600 ml  Net   -600 ml    CBG (last 3)  No results found for this basename: GLUCAP:3 in the last 72 hours  General: alert, NAD, very thin HEENT: torticollis, sclera white, MMM CV: RRR, no murmurs Pulm: CTAB, no wheezes or rales Ext: no edema Neuro: improved strength in lower extremities  Labs and Imaging  Lab 02/03/12 0640 02/02/12 1605  WBC 7.3 7.4  HGB 9.5* 10.3*  HCT 27.6* 29.6*  PLT 213 224     Lab 02/03/12 0640 02/02/12 2002 02/02/12 1605  NA 143 -- 146*  K 3.7 2.6* 2.5*  CL 111 -- 108  CO2 22 -- 27  BUN 24* -- 27*  CREATININE 1.63* -- 1.94*  LABGLOM -- -- --  GLUCOSE 90 -- --  CALCIUM 9.6 -- 10.7*    Assessment and Plan [name] is a 77 y.o. year old male presenting with weakness and falls.   # Weakness and falls: Likely multifactorial with contributions from possible subacute stroke, deconditioning secondary to malignancy, possible new brain malignancy not imaged on head CT, electrolyte abnormalities.  - f/u PT recommendations - f/u orthostatic vitals  # Hypokalemia: Patient significantly potassium depleted. This may be secondary to nutritional status versus effect of malignancy. Magnesium level okay.  Resolved. - will change K-Dur to BID - repeat BMP this afternoon  # Lung  cancer: Currently stage IV with metastatic disease. Currently stable. Discussed with Dr. Sofie Hartigan who states patient currently on a wait and see management for new RLL nodule. - f/u goals of care meeting  # Hypertension: On multiple medications. Plan to continue all medications and follow.  - will taper down clonidine due to concern for orthostatic hypotension  **decrease to 0.2mg  BID today, 0.1mg  BID on 6/5, 0.1mg  qAM on 6/7, d/c on 6/9  # Anemia: likely secondary to chronic disease.  - will continue to monitor  # Social: Perhaps the most concerning issue today. Mr. Hoehn likely will require 24-hour supervision but his family will not be able to do that. Plan for social work consult along with physical therapy consult. May benefit from hospice services.  - f/u goals of care meeting - f/u SW consult - f/u PT consult  # Acute on chronic renal failure: Patient has a mild increase of creatinine. This is likely secondary to mild dehydration with renin angiotensin pathway blockage vs urinary retention. Plan to use fluid rehydration and follow.  Improved slightly to 1.63 - continue to monitor  # Urinary retention: Foley placed last night with return of 600cc urine.  Chronic catheter removed 1 week ago. - will d/c doxazosin due to falls and concern for orthostatic hypotension - will increase flomax to 0.8mg   daily - voiding trial tomorrow  - if fails trial tomorrow will d/c with Foley in place and urology follow up   FEN/GI: SLIV, cardiac diet PPx: SQ heparin Disposition: Pending workup   BOOTH, Heron Pitcock Pager: [phone] 02/03/2012, 9:45 AM

## 2012-02-03 NOTE — Progress Notes (Signed)
Pt c/o urinary retention, which he has a history of, received an order to place foley catheter. 77F foley catheter placed with 600 cc of clear, yellow urine output. Pt feels much better. Will continue to monitor. Earnest Conroy RN

## 2012-02-03 NOTE — Progress Notes (Signed)
I discussed with  Dr Booth.  I agree with their plans documented in their progress note for today.  

## 2012-02-03 NOTE — Plan of Care (Signed)
Problem: Phase I Progression Outcomes Goal: Voiding-avoid urinary catheter unless indicated Outcome: Not Progressing Urinary retention-catheter placed 02/03/2012

## 2012-02-03 NOTE — Evaluation (Signed)
Physical Therapy Evaluation Patient Details Name: [name] MRN: [id number] DOB: 17-Dec-1934 Today's Date: 02/03/2012 Time: 7829-5621 PT Time Calculation (min): 25 min  PT Assessment / Plan / Recommendation Clinical Impression  Patient presents with stage IV metastatic cancer with craniotomy and lobectomy who has had a recent onset of weakness and falls. I have discusssed his care at discharge with the patient's family and they can not provide 24 hour care. Mr. Dattilo will therefore require  SNF at discharge to improve his safety with functional mobility.    PT Assessment  Patient needs continued PT services    Follow Up Recommendations  Skilled nursing facility    Barriers to Discharge Decreased caregiver support      lEquipment Recommendations  Defer to next venue    Recommendations for Other Services  OT consult   Frequency Min 3X/week    Precautions / Restrictions Precautions Precautions: Fall   Pertinent Vitals/Pain VSS/ no pain      Mobility  Bed Mobility Bed Mobility: Supine to Sit;Sitting - Scoot to [place] of Bed;Sit to Supine;Scooting to [facility] Supine to Sit: 4: Min assist;HOB elevated Sitting - Scoot to Edge of Bed: 4: Min guard Sit to Supine: 5: Supervision Scooting to [facility]: 3: Mod assist Details for Bed Mobility Assistance: Patient with inconsistent performance based on mood. Asks for assistance to sit up and then when assistance is provided patient asks what PT is doing.  Transfers Transfers: Sit to Stand;Stand to Sit Sit to Stand: 4: Min assist;With upper extremity assist;From bed Stand to Sit: 4: Min guard;With upper extremity assist;To bed Details for Transfer Assistance: Definite need for walker to stabilize upon standing. Assistance to stadn through range for safety.  Ambulation/Gait Ambulation/Gait Assistance: 3: Mod assist Ambulation Distance (Feet): 75 Feet Assistive device: Rolling walker Ambulation/Gait Assistance Details: Walks into left sided  obstacles. Posture a large lmiting factor secondary to lateral curvature of spine and torticollis limiting upright posture and clear vision of path. Verbal cues to stay inside walker.  Gait Pattern: Step-through pattern;Decreased stride length;Trunk flexed;Lateral trunk lean to right     PT Diagnosis: Difficulty walking;Generalized weakness  PT Problem List: Decreased strength;Decreased activity tolerance;Decreased balance;Decreased mobility;Decreased safety awareness;Decreased knowledge of use of DME;Decreased cognition PT Treatment Interventions: DME instruction;Gait training;Therapeutic activities;Balance training;Therapeutic exercise;Neuromuscular re-education;Cognitive remediation;Patient/family education   PT Goals Acute Rehab PT Goals PT Goal Formulation: With patient Time For Goal Achievement: 02/10/12 Potential to Achieve Goals: Good Pt will go Supine/Side to Sit: with supervision PT Goal: Supine/Side to Sit - Progress: Goal set today Pt will go Sit to Supine/Side: with supervision PT Goal: Sit to Supine/Side - Progress: Goal set today Pt will go Sit to Stand: with supervision;with upper extremity assist PT Goal: Sit to Stand - Progress: Goal set today Pt will go Stand to Sit: with supervision;with upper extremity assist PT Goal: Stand to Sit - Progress: Goal set today Pt will Ambulate: >150 feet;with supervision;with least restrictive assistive device PT Goal: Ambulate - Progress: Goal set today  Visit Information  Last PT Received On: 02/03/12 Assistance Needed: +1    Subjective Data  Subjective: Patient's family state that they are unable to provide 24 hour care at this time. Patient Stated Goal: None stated   Prior Functioning  Prior Function Driving: No Vocation: Retired Comments: Has right glass eye Communication Communication: HOH    Cognition  Overall Cognitive Status: Impaired Area of Impairment: Memory;Safety/judgement;Problem solving Arousal/Alertness:  Awake/alert Orientation Level: Disoriented to;Situation Behavior During Session: [facility] for tasks performed  Memory Deficits: Does not recall me being in his room 2 prior times to try to get him to ambulate Safety/Judgement: Decreased awareness of safety precautions;Decreased awareness of need for assistance Problem Solving: Walks into obstacles on left and requires verbal cues to identify and correct mistake.    Extremity/Trunk Assessment Left Upper Extremity Assessment LUE ROM/Strength/Tone: Deficits LUE ROM/Strength/Tone Deficits: Grossly 4/5 LUE Coordination: WFL - gross/fine motor Right Lower Extremity Assessment RLE ROM/Strength/Tone: Deficits RLE ROM/Strength/Tone Deficits: Grossly 4/5 RLE Coordination: WFL - gross/fine motor Trunk Assessment Trunk Assessment: Kyphotic (left torticollis and thoracic lateral curvature of spine)   Balance Balance Balance Assessed: Yes Static Standing Balance Static Standing - Balance Support: Bilateral upper extremity supported Static Standing - Level of Assistance: 5: Stand by assistance  End of Session PT - End of Session Equipment Utilized During Treatment: Gait belt Activity Tolerance: Patient tolerated treatment well Patient left: in bed;with call bell/phone within reach;with bed alarm set;with family/visitor present   Edwyna Perfect, PT  Pager [phone]  02/03/2012, 2:49 PM

## 2012-02-03 NOTE — Consult Note (Signed)
Consult Note from the Palliative Medicine Team at [facility] Patient ZO:XWRUEA ALVEN ALVERIO      DOB: July 14, 1935      VWU:[id number]   Consult Requested by: Dr Dione Booze     PCP: Jaclyn Shaggy, MD, MD Reason for Consultation:Goals of Care                Phone Number:[phone]   Prior to family meeting reviewed chart, spoke with staff caring for patient. Meet with patients 2 adult children Hilary Hertz [phone]) and Molly [facility]) Effie Shy [phone]). Patient has been living alone in home since the lose of his wife 4 years ago.  Would have family (sisters or children) check on him and bring meals,take him shopping but was able to take care of basic ADL's. According to daughter since his treatment for metastatic brain tumor from previously diagnosed NSCLC he has had leg weakness and sustained several falls at home. Daughter feels the steriod's used to treat the brain cancer caused notable leg weakness. Family does not feel that they can provide adequate coverage for patient to return home safely at this time. Daughter also noticed patient that patient was notably confused at times 2-3 days prior to admission on 02/02/12.   We discussed patients wishes pertinent to code status, level of aggressiveness of medical interventions, the use of antibiotics, medically administered IV hydration and artificial tube feedings. A Medical Orders for Scope of Treatment form was reviewed with family and patient, and decisions were made with patient input for a Do Not Attempt Resuscitation if cardiac arrest should occur,limited additional interventions and transfer to hospital if indicated, IV fluids for defined trial period and not to use feeding tubes for artificial nutrition. The MOST form was signed by daughter and placed on chart.   We talked about the progression of patients lung cancer, now with suspected new RLL lung nodule noted 4/13, and the possibility of recurrence of brain metastasis. Children and  patient would like to follow up with Dr Shirline Frees (Oncology) next month, and would make a decision at that time for continued local radiation for disease control. Patient was adamant he does not want to pursue systemic chemotherapy for the lung cancer at this point. We discussed the roles of palliative care and hospice care, family and patient would like to seek further treatment for local control of lung cancer, and would be willing to have Palliative care Services follow the patient upon discharge at the SNF.  Assessment and Plan: 1. Code Status:DNR/DNI 2. Symptom Control:      -patient comfortable at present 3. Psycho/Social: emotional support to patient and family 4. Spiritual: consult placed 5. Disposition: Would recommend Palliative Care Services to follow at SNF upon discharge. Will continue to follow  Patient Documents Completed or Given: Document Given Completed  Advanced Directives Pkt    MOST    YES  DNR    YES  Gone from My Sight    Hard Choices      Brief HPI: 77 yo WM admitted 02/02/12 from home due to leg weakness and falls at home. Diagnosed with NSCLC Stg 1B in 2011, s/p RUL lobectomy 04/10/10. 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    ROS: Denies pain, insomnia, n/v, anxiety, dyspnea, constipation, depressive symptoms, or fatigue     PMH:  Past Medical History  Diagnosis Date  . CAD (coronary artery disease)     -s/p Cypher drug eluting stent to LCX  for unstable angina in 2006  . Renal artery stenosis   . HTN (hypertension)   . Hypercholesterolemia   . Renal insufficiency     baseline Cr 1.5-1.8  . PAD (peripheral artery disease)   . 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 Torticollis   . Opacity, cornea, central     RUL opacity on CXR 9/10 - refused CT  . Adenocarcinoma     Right upper lobe   . Coordination impairment     Right Parietal Mass  . Weakness 08/20/11     Brain Mets - Left Leg and Arm Left pronator drift  . Brain cancer      Mets - Right Parietal mass - Vasogenic Edema  . Foley catheter in place     Since Surgery 09/30/11     PSH: Past Surgical History  Procedure Date  . Coronary stent placement   . Bronchoscopy 04/10/2010    [facility]  . Rt vats, rt upper lobectomy, mediastinal lymph node dissection 04/10/2010    [facility]  . Left kidney stent placement   . Hemorroidectomy   . Craniotomy 09/30/2011    Procedure: CRANIOTOMY TUMOR EXCISION;  Surgeon: Mariam Dollar, MD;  Location: [facility];  Service: Neurosurgery;  Laterality: Right;  Right Frontal Parietal Stealth Craniotomy   I have reviewed the FH and SH and  If appropriate update it with new information. Allergies  Allergen Reactions  . Sulfonamide Derivatives Hives   Scheduled Meds:   . sodium chloride   Intravenous Once  . aliskiren  300 mg Oral Daily  . amLODipine  10 mg Oral Daily  . aspirin EC  81 mg Oral Daily  . atorvastatin  20 mg Oral QHS  . carvedilol  25 mg Oral BID  . cloNIDine  0.2 mg Oral BID  . docusate sodium  100 mg Oral BID  . enoxaparin  40 mg Subcutaneous QHS  . hydrochlorothiazide  25 mg Oral Daily  . pantoprazole  40 mg Oral Q1200  . potassium chloride  10 mEq Intravenous Once  . potassium chloride  10 mEq Intravenous Once  . potassium chloride  40 mEq Oral Once  . potassium chloride  40 mEq Oral Once  . potassium chloride  40 mEq Oral BID  . sodium chloride  3 mL Intravenous Q12H  . Tamsulosin HCl  0.8 mg Oral QHS  . DISCONTD: cefTRIAXone (ROCEPHIN) IVPB 1 gram/50 mL D5W  1 g Intravenous Q24H  . DISCONTD: cloNIDine  0.3 mg Oral BID  . DISCONTD: doxazosin  1 mg Oral QHS  . DISCONTD: potassium chloride  40 mEq Oral QID  . DISCONTD: simvastatin  40 mg Oral QHS  . DISCONTD: Tamsulosin HCl  0.4 mg Oral Daily   Continuous Infusions:   . DISCONTD: 0.45 % NaCl with KCl 20 mEq / L 50 mL/hr at 02/03/12 0040   PRN Meds:.acetaminophen,  acetaminophen, cyclobenzaprine, LORazepam    BP 156/88  Pulse 60  Temp(Src) 98.2 F (36.8 C) (Oral)  Resp 18  Ht 5\' 10"  (1.778 m)  Wt 54.4 kg (119 lb 14.9 oz)  BMI 17.21 kg/m2  SpO2 100%   PPS: 60%   Intake/Output Summary (Last 24 hours) at 02/03/12 1453 Last data filed at 02/03/12 0500  Gross per 24 hour  Intake      0 ml  Output    600 ml  Net   -600 ml   LBM: prior to  admission 02/02/12                     Stool Softner:Colace  Serum Albumin: 02/02/12:  3.1                Physical Exam:  General: Cachectic, alert, but very HOH HEENT:  R eyeball prosthesis, L eye anicteric, mouth moist  Chest: CTA bilaterally, diminished at bases CVS: RRR, no MGR Abdomen:  Soft , non-distended, BS audible                                                                                                                                                        Ext: no pedal edema present Neuro: Oriented to person, place, not time. Alert/aroused by verbal stimuli, responses inappropriate at times may be related difficulty in hearing  Labs: CBC    Component Value Date/Time   WBC 7.3 02/03/2012 0640   WBC 7.9 12/16/2011 1142   RBC 3.22* 02/03/2012 0640   RBC 3.47* 12/16/2011 1142   HGB 9.5* 02/03/2012 0640   HGB 10.9* 12/16/2011 1142   HCT 27.6* 02/03/2012 0640   HCT 32.7* 12/16/2011 1142   PLT 213 02/03/2012 0640   PLT 299 12/16/2011 1142   MCV 85.7 02/03/2012 0640   MCV 94.4 12/16/2011 1142   MCH 29.5 02/03/2012 0640   MCH 31.5 12/16/2011 1142   MCHC 34.4 02/03/2012 0640   MCHC 33.3 12/16/2011 1142   RDW 15.0 02/03/2012 0640   RDW 15.3* 12/16/2011 1142   LYMPHSABS 1.2 02/02/2012 1605   LYMPHSABS 1.0 12/16/2011 1142   MONOABS 0.7 02/02/2012 1605   MONOABS 0.7 12/16/2011 1142   EOSABS 0.0 02/02/2012 1605   EOSABS 0.1 12/16/2011 1142   BASOSABS 0.0 02/02/2012 1605   BASOSABS 0.1 12/16/2011 1142    BMET    Component Value Date/Time   NA 143 02/03/2012 0640   NA 144 12/16/2011 1142   K 3.7 02/03/2012 0640   K 3.3  12/16/2011 1142   CL 111 02/03/2012 0640   CL 103 12/16/2011 1142   CO2 22 02/03/2012 0640   CO2 29 12/16/2011 1142   GLUCOSE 90 02/03/2012 0640   GLUCOSE 128* 12/16/2011 1142   BUN 24* 02/03/2012 0640   BUN 27* 12/16/2011 1142   CREATININE 1.63* 02/03/2012 0640   CREATININE 1.3* 12/16/2011 1142   CALCIUM 9.6 02/03/2012 0640   CALCIUM 9.4 12/16/2011 1142   GFRNONAA 40* 02/03/2012 0640   GFRAA 46* 02/03/2012 0640    CMP     Component Value Date/Time   NA 143 02/03/2012 0640   NA 144 12/16/2011 1142   K 3.7 02/03/2012 0640   K 3.3 12/16/2011 1142   CL 111 02/03/2012 0640   CL 103 12/16/2011 1142   CO2 22 02/03/2012 0640   CO2 29  12/16/2011 1142   GLUCOSE 90 02/03/2012 0640   GLUCOSE 128* 12/16/2011 1142   BUN 24* 02/03/2012 0640   BUN 27* 12/16/2011 1142   CREATININE 1.63* 02/03/2012 0640   CREATININE 1.3* 12/16/2011 1142   CALCIUM 9.6 02/03/2012 0640   CALCIUM 9.4 12/16/2011 1142   PROT 5.3* 02/02/2012 1605   PROT 5.6* 12/16/2011 1142   ALBUMIN 3.1* 02/02/2012 1605   AST 8 02/02/2012 1605   AST 14 12/16/2011 1142   ALT <5 02/02/2012 1605   ALKPHOS 47 02/02/2012 1605   ALKPHOS 45 12/16/2011 1142   BILITOT 0.3 02/02/2012 1605   BILITOT 0.60 12/16/2011 1142   GFRNONAA 40* 02/03/2012 0640   GFRAA 46* 02/03/2012 0640    Chest Xray Reviewed/Impressions:02/02/12  IMPRESSION:  No acute disease.   CT scan of the Head Reviewed/Impressions:02/02/12  IMPRESSION:  1. Interval development of a small area of low attenuation in the  in the right corona radiata, suspicious for an area of subacute  ischemia. Otherwise, the appearance of brain is similar to the  prior examination, as detailed above (within the limitations of  this exam which is compromised by extensive patient motion).  2. Bilateral mastoid effusions and extensive paranasal sinus  disease redemonstrated.   Time In Time Out Total Time Spent with Patient Total Overall Time    1:00p  2:45p  60 min  105 min    Greater than 50%  of this time was spent counseling and  coordinating care related to the above assessment and plan.   Freddie Breech, CNS-C Palliative Medicine Team [facility] Team Phone: [phone] Pager: [phone]

## 2012-02-03 NOTE — Plan of Care (Signed)
Patient seen and examined at request of nurse/daughter. She was worried about his mental status.  Patient was examined.  Filed Vitals:   02/03/12 0944 02/03/12 1000 02/03/12 1004 02/03/12 1400  BP: 138/80 143/77 123/74 156/88  Pulse: 69 62 73 60  Temp:  98.7 F (37.1 C)  98.2 F (36.8 C)  TempSrc:  Oral  Oral  Resp:  18  18  Height:      Weight:      SpO2:  100% 100% 100%  Patient hard of hearing AOx2 Neurologic exam : Cn 2-7 intact Strength equal & normal in upper & lower extremities Finger to nose normal. Able to track finger with eyes.  He was easily arousable, awake and alert.   A/P: Likely hospital sun-downing -will continue to monitor. - I discussed this with the nurse.  Edd Arbour, MD 7:11 PM 02/03/2012

## 2012-02-03 NOTE — Progress Notes (Signed)
Spoke with pt daughter on phone. Daughter very concerned with patients increased confusion. Family medicine resident on call notified and made aware. Daughter requesting MRI of brain. MD made aware. Pt is currently pleasantly alert and oriented to self only. Will continue to monitor. Ramond Craver, RN

## 2012-02-03 NOTE — Progress Notes (Signed)
Palliative Medicine Team consult for goals of care requested spoke with Dr Durene Cal to confirm; spoke with patient at bedside who is agreeable and requests this RN contact his daughter Aggie Cosier - Crystal contacted (h: [phone]) she requests meeting today if possible as she will need to be at work tomorrow, meeting scheduled for today, Monday 02/03/12 @ 1:00 pm A courtesy call was placed and a message left at Dr Asa Lente office to make him aware of above.  Valente David, RN 02/03/2012, 9:05 AM Palliative Medicine Team RN Liaison [phone]

## 2012-02-03 NOTE — Progress Notes (Signed)
Clinical Child psychotherapist (CSW) aware of PT recommendation for SNF placement however CSW will await Palliative Care progress not to determine pt wishes to determine disposition.   Full assessment to follow.  Theresia Bough, MSW, Theresia Majors [phone]

## 2012-02-03 NOTE — Progress Notes (Signed)
Utilization review complete 

## 2012-02-03 NOTE — Telephone Encounter (Signed)
The palliative care team is meeting with pt and family today at 32

## 2012-02-03 NOTE — H&P (Signed)
Seen and examined.  Discussed with Dr. Denyse Amass.  Agree with his management.  Briefly  77 yo male with Stage 4 Lung Ca - I am a bit unclear where he stands in his treatment.  Admitted with increasing weakness that is likely multifactorial.  Contributing factors: Hypokalemia Likely mild dehydration Urinary retention  He feels better this morning with foley in place.  BP initially high and now low.  He may be overmedicated with his antihypertensives.  Probably the most important intervention is to clarify goals of care and understand where we are in his cancer treatment plan.  Contact oncologist and consider consult palliative care.    Likely we will need to address his urinary retention.  Options are uro opinion for intervention that could yield prompt relief or start proscar family drug which might help but will take months.

## 2012-02-04 ENCOUNTER — Inpatient Hospital Stay (HOSPITAL_COMMUNITY): Payer: Medicare Other

## 2012-02-04 DIAGNOSIS — F29 Unspecified psychosis not due to a substance or known physiological condition: Secondary | ICD-10-CM

## 2012-02-04 DIAGNOSIS — R5383 Other fatigue: Secondary | ICD-10-CM

## 2012-02-04 DIAGNOSIS — R627 Adult failure to thrive: Secondary | ICD-10-CM

## 2012-02-04 DIAGNOSIS — R5381 Other malaise: Secondary | ICD-10-CM

## 2012-02-04 LAB — CBC
MCH: 29.9 pg (ref 26.0–34.0)
Platelets: 211 10*3/uL (ref 150–400)
RBC: 3.35 MIL/uL — ABNORMAL LOW (ref 4.22–5.81)
WBC: 6.1 10*3/uL (ref 4.0–10.5)

## 2012-02-04 LAB — URINE CULTURE
Colony Count: NO GROWTH
Culture: NO GROWTH

## 2012-02-04 LAB — BASIC METABOLIC PANEL
Calcium: 9.6 mg/dL (ref 8.4–10.5)
GFR calc non Af Amer: 42 mL/min — ABNORMAL LOW (ref >90)
Glucose, Bld: 103 mg/dL — ABNORMAL HIGH (ref 70–99)
Sodium: 144 mEq/L (ref 135–145)

## 2012-02-04 MED ORDER — LORAZEPAM 1 MG PO TABS
1.0000 mg | ORAL_TABLET | Freq: Once | ORAL | Status: AC
  Administered 2012-02-05: 1 mg via ORAL
  Filled 2012-02-04: qty 1

## 2012-02-04 MED ORDER — TAMSULOSIN HCL 0.4 MG PO CAPS
0.8000 mg | ORAL_CAPSULE | Freq: Every day | ORAL | Status: DC
  Administered 2012-02-04 – 2012-02-06 (×3): 0.8 mg via ORAL
  Filled 2012-02-04 (×3): qty 2

## 2012-02-04 MED ORDER — ENSURE COMPLETE PO LIQD
237.0000 mL | Freq: Two times a day (BID) | ORAL | Status: DC
  Administered 2012-02-04 – 2012-02-05 (×2): 237 mL via ORAL

## 2012-02-04 NOTE — Evaluation (Signed)
Occupational Therapy Evaluation Patient Details Name: [name] MRN: [id number] DOB: 13-Mar-1935 Today's Date: 02/04/2012 Time: 9562-1308 OT Time Calculation (min): 53 min  OT Assessment / Plan / Recommendation Clinical Impression  77 yo male admitted s/p multiple falls at home with past medical history stage IV metastatic cancer with craniotomy and lobectomy who has had a recent onset of weakness and falls. Recommend d/c to SNF    OT Assessment  Patient needs continued OT Services    Follow Up Recommendations  Skilled nursing facility    Barriers to Discharge Decreased caregiver support (lives ALONE) Pt's has daughter and son that work full time and can not provide 24/ 7 (A).   Equipment Recommendations  Defer to next venue    Recommendations for Other Services    Frequency  Min 2X/week    Precautions / Restrictions Precautions Precautions: Fall   Pertinent Vitals/Pain Monitored and stable    ADL  Toilet Transfer: Simulated;Minimal assistance Toilet Transfer Method: Sit to stand Toilet Transfer Equipment: Raised toilet seat with arms (or 3-in-1 over toilet) Equipment Used: Gait belt;Rolling walker Transfers/Ambulation Related to ADLs: Pt ambulated in hall way ~150 ft and fatigued requesting to return to room. Pt demonstrates walking outside of RW and requiring (A) for correct positioning. Pt turning with 8 steps or greater turns indicating fall risk. Pt with gait velocity 23 seconds in 32 feet indicating fall risk.  ADL Comments: pt wears a life alert button however with max questioning cues could not provided a correct use for when to push the button. Pt reports multiple falls and states "ive never pushed that button" when asked when should the button be pushed pt states "if you fell I guess." Pt reports time during session and repeatly ask the time of day . Pt perseverating on the need to obtain personal walker. Pt states "i am just as likely to fall here as at home" but  with further questioning clarifies this statement to mean "i am not scared to falling anymore here than at home" PTs daughter Crystal present and very concerned with pt d/c home alone. SW called and arriving at room to (A) family. Pt demonstrates alternating attention.     OT Diagnosis: Generalized weakness;Cognitive deficits  OT Problem List: Decreased strength;Decreased activity tolerance;Impaired balance (sitting and/or standing);Decreased safety awareness;Decreased knowledge of use of DME or AE;Decreased knowledge of precautions OT Treatment Interventions: Self-care/ADL training;Therapeutic exercise;Neuromuscular education;DME and/or AE instruction;Therapeutic activities;Cognitive remediation/compensation;Patient/family education;Balance training   OT Goals Acute Rehab OT Goals OT Goal Formulation: With patient/family Time For Goal Achievement: 02/18/12 Potential to Achieve Goals: Fair ADL Goals Pt Will Perform Grooming: with min assist;Sitting at sink ADL Goal: Grooming - Progress: Goal set today Pt Will Perform Upper Body Bathing: with min assist;Sitting at sink ADL Goal: Upper Body Bathing - Progress: Goal set today Pt Will Perform Lower Body Bathing: with min assist;Sitting at sink ADL Goal: Lower Body Bathing - Progress: Goal set today Pt Will Perform Upper Body Dressing: with min assist;Sitting, bed;Sitting, chair ADL Goal: Upper Body Dressing - Progress: Goal set today Pt Will Perform Lower Body Dressing: with min assist;Sitting, chair;Sitting, bed ADL Goal: Lower Body Dressing - Progress: Goal set today Pt Will Transfer to Toilet: with min assist;3-in-1;Ambulation ADL Goal: Toilet Transfer - Progress: Goal set today  Visit Information  Last OT Received On: 02/04/12 Assistance Needed: +1    Subjective Data  Subjective: "I could do better with my walker not that thing" Pt verbalizes the need for his own  personal walker. Patient Stated Goal: to go home and use my walker     Prior Functioning  Home Living Lives With: Alone Available Help at Discharge: Family;Available PRN/intermittently Type of Home: House Bathroom Shower/Tub: Engineer, manufacturing systems: Standard Home Adaptive Equipment: Bedside commode/3-in-1;Walker - rolling;Shower chair with back Prior Function Level of Independence: Needs assistance Needs Assistance: Meal Prep;Light Housekeeping Meal Prep: Total Light Housekeeping: Total Driving: No Vocation: Retired Comments:   Has right glass eye  Communication Communication: HOH Dominant Hand: Right    Cognition  Overall Cognitive Status: Impaired Area of Impairment: Memory;Safety/judgement;Problem solving Arousal/Alertness: Awake/alert Orientation Level: Disoriented to;Situation Behavior During Session: WFL for tasks performed Memory: Decreased recall of precautions Safety/Judgement: Decreased awareness of safety precautions;Decreased safety judgement for tasks assessed    Extremity/Trunk Assessment Right Upper Extremity Assessment RUE ROM/Strength/Tone: Within functional levels Left Upper Extremity Assessment LUE ROM/Strength/Tone: Deficits LUE ROM/Strength/Tone Deficits: Grossly 4/5 Trunk Assessment Trunk Assessment: Kyphotic   Mobility Bed Mobility Supine to Sit: 4: Min assist Transfers Sit to Stand: 4: Min assist;With upper extremity assist;From bed Stand to Sit: 4: Min assist;With upper extremity assist;To bed   Exercise    Balance    End of Session OT - End of Session Activity Tolerance: Patient limited by fatigue Patient left: in bed;with call bell/phone within reach Nurse Communication: Mobility status;Other (comment) (dc barriers)    PT IS NOT SAFE TO D/C HOME ALONE. Pt with cognitive deficits and could benefit from a psych consult to help with POA .  Harrel Carina [facility] 02/04/2012, 2:59 PM Pager: [phone]

## 2012-02-04 NOTE — Progress Notes (Signed)
Spoke with CSW and CM who had extensive discussion with patient and his daughter.  At this time he is agreeable to SNF placement.  Will need qualifying 3 day stay.  CSW and CM requesting psych consult for capacity evaluation in case he starts changing his decision.  Psych consults ordered.  BOOTH, Paw Karstens 02/04/2012, 2:49 PM

## 2012-02-04 NOTE — Progress Notes (Signed)
I have seen and examined this patient. I have discussed with Dr Elwyn Reach.  I agree with their findings and plans as documented in their progess and supplement note for today. Plan for SNF and Psych consultation for capacity to make medical/disposition decisions.

## 2012-02-04 NOTE — Progress Notes (Signed)
FMTS Daily Intern Progress Note  Subjective: Patient doing well this morning.  No pain.  States legs feel better.  No CP, SOB, abd pain.  Eating okay.  12 beat run of VT this morning; vitals stable, asymptomatic.  I have reviewed the patient's medications.  Objective Temp:  [97.8 F (36.6 C)-98.7 F (37.1 C)] 98.7 F (37.1 C) (06/04 0744) Pulse Rate:  [59-73] 59  (06/04 0744) Resp:  [16-18] 16  (06/04 0744) BP: (123-179)/(74-91) 168/85 mmHg (06/04 0744) SpO2:  [99 %-100 %] 99 % (06/04 0744) Weight:  [114 lb 13.8 oz (52.1 kg)] 114 lb 13.8 oz (52.1 kg) (06/04 0500)   Intake/Output Summary (Last 24 hours) at 02/04/12 0915 Last data filed at 02/03/12 1507  Gross per 24 hour  Intake      0 ml  Output    350 ml  Net   -350 ml    CBG (last 3)  No results found for this basename: GLUCAP:3 in the last 72 hours  General: alert, NAD, very thin HEENT: torticollis, sclera white, MMM CV: RRR, no murmurs Ext: no edema Neuro: 5/5 strength in all extremities  Labs and Imaging  Lab 02/03/12 0640 02/02/12 1605  WBC 7.3 7.4  HGB 9.5* 10.3*  HCT 27.6* 29.6*  PLT 213 224     Lab 02/04/12 0635 02/03/12 2013 02/03/12 0640  NA 144 145 143  K 3.9 3.9 3.7  CL 112 113* 111  CO2 17* 24 22  BUN 22 24* 24*  CREATININE 1.55* 1.65* 1.63*  LABGLOM -- -- --  GLUCOSE 103* -- --  CALCIUM 9.6 9.8 9.6    Assessment and Plan [name] is a 77 y.o. year old male presenting with weakness and falls.   # Weakness and falls: Likely multifactorial with contributions from possible subacute stroke, deconditioning secondary to malignancy, possible new brain malignancy not imaged on head CT, electrolyte abnormalities.  Orthostatics normal form lying to sitting, unable to assess standing BP. - PT recommendations: SNF - pt currently refusing SNF  # Hypokalemia: Patient significantly potassium depleted. This may be secondary to nutritional status versus effect of malignancy. Magnesium level okay.   Resolved. - continue K-Dur BID  # Lung cancer: Currently stage IV with metastatic disease. Currently stable. Discussed with Dr. Sofie Hartigan who states patient currently on a wait and see management for new RLL nodule. - f/u goals of care meeting: DNR/DNI - given family concern about new CT finding, spoke with oncologist and radiation oncologist who states that if we can get an MRI before discharge that would be helpful  # Hypertension: On multiple medications. Plan to taper back some medications to prevent presumed orthostatic hypotension.  - will taper down clonidine due to concern for orthostatic hypotension  **decrease to 0.2mg  BID 6/3, 0.1mg  BID on 6/5, 0.1mg  qAM on 6/7, d/c on 6/9  # Anemia: likely secondary to chronic disease.  - will continue to monitor  # Social: Perhaps the most concerning issue today. [name] likely will require 24-hour supervision but his family will not be able to do that. Plan for social work consult along with physical therapy consult. May benefit from hospice services.  - goals of care meeting: DNR/DNI, palliative care will continue to follow - appreciate SW involvement  # Acute on chronic renal failure: Patient has a mild increase of creatinine. This is likely secondary to mild dehydration with renin angiotensin pathway blockage vs urinary retention. Plan to use fluid rehydration and follow.  Resolved.   #  Urinary retention: Foley placed 6/2 with return of 600cc urine.  Chronic catheter removed 1 week ago. - will d/c doxazosin due to falls and concern for orthostatic hypotension - continue flomax to 0.8mg  daily - will d/c with foley in place; follow up with Dr. Leonette Monarch    FEN/GI: SLIV, cardiac diet PPx: SQ heparin Disposition: d/c after MRI and OT recommendations   Lynch, Rodney Maack Pager: [phone] 02/04/2012, 9:15 AM

## 2012-02-04 NOTE — Progress Notes (Signed)
Patient's blood pressure has been elevated 170's systolic, and 80's diastolic per 02/03/12 around 2300 recordings( BP 178/86), and 6AM vital signs. MD on call  made aware. Told to monitor blood pressure, and was not concerned about the blood pressure elevations due to renal function and other chronic ailments. Patient asymptomatic. Will continue to monitor patient.

## 2012-02-04 NOTE — Progress Notes (Signed)
Clinical Child psychotherapist (CSW) was contacted by OT that pt daughter was in the room discussing placement options to pt. CSW visited pt room with RNCM and discussed with pt (again) the need for him to go to [facility] rehab for PT. CSW informed pt that it could be at a facility days-weeks depending on pt progress with PT. Pt asked about his walker and desire to have his walker with him. Pt daughter stated she would deliver walker to pt and pt then agreed to go to a rehab facility. CSW provided pt daughter with a SNF list and informed her that CSW would fax pt information electronically to facilities in [place] and [place] (per daughter request.) Pt asked CSW to follow up with daughter, as pt is very hard of hearing. CSW concerned that pt mental status comes and goes and therefore spoke with Dr. Elwyn Reach about having a psych evaluation.   CSW to follow up and facilitate with dc planning.   Theresia Bough, MSW, Theresia Majors [phone]

## 2012-02-04 NOTE — Progress Notes (Signed)
Patient ZO:XWRUEA BRAYSEN Lynch      DOB: [birth year]      VWU:[id number]   Palliative Medicine Team at [facility] Progress Note    Subjective: patient alert/oriented, still continues to have bouts of confusion.   Filed Vitals:   02/04/12 1445  BP: 150/82  Pulse: 61  Temp: 98.9 F (37.2 C)  Resp: 18   Physical exam:  General: Cachectic, alert, but very HOH  HEENT: R eye prosthesis, L eye anicteric, mouth moist, dentition poor  Chest: CTA bilaterally, diminished at bases  CVS: RRR, no MGR  Abdomen: Soft , non-distended, BS audible  Extrem's: no pedal edema Neuro: per daughter patient is intermittently confused   Assessment and plan:  77 yo WM admitted 02/02/12 from home due to leg weakness and falls at home. Diagnosed with NSCLC Stg 1B in 2011, s/p RUL lobectomy 04/10/10. 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 Now with intermittenrt confusion, scheduled to get MRI to evaluate for brain mets, also patient is scheduled to be evaluated by psychiatry.   Assessment and Plan:  1. Code Status:DNR/DNI 2. Symptom Control: -patient comfortable at present  3. Psycho/Social: emotional support to patient and family 4. Spiritual: consult placed 5. Disposition: Would recommend Palliative Care Services to follow at SNF upon discharge. Spoke with Dr Elwyn Reach informed her that [facility] services should be recommended in discharge summary to SNF. Daughter [name] also given information on PCS. Will continue to follow  Time In Time Out Total Time Spent with Patient Total Overall Time  5:45p 6:15p 30 min 30 min   Greater than 50%  of this time was spent counseling and coordinating care related to the above assessment and plan.  [name], CNS-C Palliative Medicine Team [facility] Team Phone: [phone] Pager: [phone]

## 2012-02-04 NOTE — Progress Notes (Signed)
Pt had 12 beat run of Vtach. Vitals stable. Pt has no complaints. MD made aware. CBC to be redrawn - lab did not get enough blood for result. Duwaine Maxin, RN

## 2012-02-04 NOTE — Progress Notes (Signed)
INITIAL ADULT NUTRITION ASSESSMENT Date: 02/04/2012   Time: 12:35 PM  Reason for Assessment: Underweight  ASSESSMENT: Male 77 y.o.  Dx: Lower extremity weakness  Hx:  Past Medical History  Diagnosis Date  . CAD (coronary artery disease)     -s/p Cypher drug eluting stent to LCX for unstable angina in 2006  . Renal artery stenosis   . HTN (hypertension)   . Hypercholesterolemia   . Renal insufficiency     baseline Cr 1.5-1.8  . PAD (peripheral artery disease)   . 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 Torticollis   . Opacity, cornea, central     RUL opacity on CXR 9/10 - refused CT  . Adenocarcinoma     Right upper lobe   . Coordination impairment     Right Parietal Mass  . Weakness 08/20/11    Brain Mets - Left Leg and Arm Left pronator drift  . Brain cancer      Mets - Right Parietal mass - Vasogenic Edema  . Foley catheter in place     Since Surgery 09/30/11    Related Meds:     . aliskiren  300 mg Oral Daily  . amLODipine  10 mg Oral Daily  . aspirin EC  81 mg Oral Daily  . atorvastatin  20 mg Oral QHS  . carvedilol  25 mg Oral BID  . cloNIDine  0.2 mg Oral BID  . docusate sodium  100 mg Oral BID  . enoxaparin  40 mg Subcutaneous QHS  . hydrochlorothiazide  25 mg Oral Daily  . pantoprazole  40 mg Oral Q1200  . potassium chloride  40 mEq Oral BID  . sodium chloride  3 mL Intravenous Q12H  . Tamsulosin HCl  0.8 mg Oral Daily  . DISCONTD: Tamsulosin HCl  0.8 mg Oral QHS    Ht: 5\' 10"  (177.8 cm)  Wt: 114 lb 13.8 oz (52.1 kg)  Ideal Wt: 75.4 kg % Ideal Wt: 69%  Usual Wt: 134 lb -- per office visit record 09/16/11 % Usual Wt: 85%  Body mass index is 16.48 kg/(m^2).  Food/Nutrition Related Hx: no triggers per admission nutrition screen  Labs:  CMP     Component Value Date/Time   NA 144 02/04/2012 0635   NA 144 12/16/2011 1142   K 3.9 02/04/2012 0635   K 3.3 12/16/2011 1142   CL 112 02/04/2012 0635   CL 103 12/16/2011 1142    CO2 17* 02/04/2012 0635   CO2 29 12/16/2011 1142   GLUCOSE 103* 02/04/2012 0635   GLUCOSE 128* 12/16/2011 1142   BUN 22 02/04/2012 0635   BUN 27* 12/16/2011 1142   CREATININE 1.55* 02/04/2012 0635   CREATININE 1.3* 12/16/2011 1142   CALCIUM 9.6 02/04/2012 0635   CALCIUM 9.4 12/16/2011 1142   PROT 5.3* 02/02/2012 1605   PROT 5.6* 12/16/2011 1142   ALBUMIN 3.1* 02/02/2012 1605   AST 8 02/02/2012 1605   AST 14 12/16/2011 1142   ALT <5 02/02/2012 1605   ALKPHOS 47 02/02/2012 1605   ALKPHOS 45 12/16/2011 1142   BILITOT 0.3 02/02/2012 1605   BILITOT 0.60 12/16/2011 1142   GFRNONAA 42* 02/04/2012 0635   GFRAA 49* 02/04/2012 0635     Intake/Output Summary (Last 24 hours) at 02/04/12 1437 Last data filed at 02/04/12 1000  Gross per 24 hour  Intake      0 ml  Output   1000 ml  Net  -1000 ml    Diet Order: General  Supplements/Tube Feeding: N/A  IVF: saline lock IV  Estimated Nutritional Needs:   Kcal: 1700-1900 Protein: 80-90 gm Fluid: 1.7-1.9 L  Patient with Stage 4 lung cancer s/p lobectomy and craniotomy (January 2013) who presented with progressive lower extremity weakness and falls; RD spoke with patient's daughter via telephone regarding nutrition hx -- states her Dad has had a decreased appetite; family members would sometimes brings him meals; eats on average 2 meals per day.  RD interprets this intake as patient meeting < 75% of estimated energy needs; daughter also reports he's lost weight since his surgery; no chewing or swallowing issues reported; noted Palliative Care consult ordered; likes chocolate Ensure supplements -- RD to order.  Patient meets criteria for severe malnutrition in the context of chronic illness given 15% weight loss in < 6 months and < 75% intake of estimated energy requirement for > 1 month.  NUTRITION DIAGNOSIS: -Unintentional Weight Loss  RELATED TO: inadequate oral intake  AS EVIDENCE BY: 15% weight loss in < 6 months  MONITORING/EVALUATION(Goals): Goal: Oral  intake to meet >90% of estimated nutrition needs to prevent further weight loss Monitor: PO & supplemental intake, goals of care, weight, labs, I/O's  EDUCATION NEEDS: -No education needs identified at this time  INTERVENTION:  Add Ensure Complete BID (350 kcals, 13 gm protein per 8 fl oz bottle)  RD to follow for nutrition care plan  Dietitian #: [phone]  DOCUMENTATION CODES Per approved criteria  -Severe malnutrition in the context of chronic illness -Underweight    Alger Memos 02/04/2012, 12:35 PM

## 2012-02-04 NOTE — Clinical Social Work Psychosocial (Signed)
     Clinical Social Work Department BRIEF PSYCHOSOCIAL ASSESSMENT 02/04/2012  Patient:  [name], [name]     Account Number:  [id number]     Admit date:  02/02/2012  Clinical Social Worker:  Lourdes Sledge  Date/Time:  02/04/2012 11:02 AM  Referred by:  Physician  Date Referred:  02/04/2012 Referred for  SNF Placement   Other Referral:   Interview type:  Patient Other interview type:   CSW also spoke with pt daughter [name]    PSYCHOSOCIAL DATA Living Status:  ALONE Admitted from facility:   Level of care:   Primary support name:  [name] Primary support relationship to patient:  CHILD, ADULT Degree of support available:   Pt lives at home and does not have 24hr care. Pt has a son and a daughter however are not able to provide 24hr care.    CURRENT CONCERNS Current Concerns  Post-Acute Placement   Other Concerns:    SOCIAL WORK ASSESSMENT / PLAN CSW received referral for placement.From previous note pt has had capacity however last night appeared to be confused.    CSW visited pt room and pt was sitting up in bed. CSW asked pt questions pertaining to his living situation. Pt stated hes been living alone in his home in [place] for 10 years. Pt stated his daughter and son don't live to far and his friends are also nearby. When CSW explored pt feelings about going to a facility for ST rehab the pt because upset and stated he wasn't going anywhere but home! Pt asked for CSW to leave and let him eat his breakfast. CSW asked if CSW could speak with pt daughter and pt agreed and gave consent.    CSW contacted pt daughter [name] and informed her of CSW conversation with pt. CSW informed daughter that pt appeared oriented and questions were answered appropriately and that if pt has capacity he is able to make his own decisions. Pt daughter agreed that pt has capacity and stated yesterday pt had said he was agreeable to ST rehab. CSW encouraged daughter to  talk to pt again when she comes to the hospital. CSW will meet with daughter in pt room later today.   Assessment/plan status:  Psychosocial Support/Ongoing Assessment of Needs Other assessment/ plan:   Information/referral to community resources:   none at this time.    PATIENTS/FAMILYS RESPONSE TO PLAN OF CARE: Pt was laying in bed alert and appeared oriented. Pt has a very difficult time hearing (hearing aid in left ear.) Pt is not agreeable to ST rehab and states he will only go home.    CSW spoke wtih Dr. Elwyn Reach to determine whether a psych consult is needed for capacity, at this time pt seems to be able to make his own decisions. CSW will follow up with pt and daughter today to confirm whether pt is still not agreeable to ST rehab.

## 2012-02-05 ENCOUNTER — Inpatient Hospital Stay (HOSPITAL_COMMUNITY): Payer: Medicare Other

## 2012-02-05 LAB — BASIC METABOLIC PANEL
CO2: 23 mEq/L (ref 19–32)
Calcium: 10.2 mg/dL (ref 8.4–10.5)
Sodium: 142 mEq/L (ref 135–145)

## 2012-02-05 MED ORDER — POTASSIUM CHLORIDE CRYS ER 20 MEQ PO TBCR
40.0000 meq | EXTENDED_RELEASE_TABLET | Freq: Every day | ORAL | Status: DC
  Administered 2012-02-06: 40 meq via ORAL
  Filled 2012-02-05: qty 2

## 2012-02-05 MED ORDER — GADOBENATE DIMEGLUMINE 529 MG/ML IV SOLN
15.0000 mL | Freq: Once | INTRAVENOUS | Status: AC
  Administered 2012-02-05: 15 mL via INTRAVENOUS

## 2012-02-05 MED ORDER — CLONIDINE HCL 0.1 MG PO TABS
0.1000 mg | ORAL_TABLET | Freq: Two times a day (BID) | ORAL | Status: DC
  Administered 2012-02-05 – 2012-02-06 (×3): 0.1 mg via ORAL
  Filled 2012-02-05 (×5): qty 1

## 2012-02-05 NOTE — Progress Notes (Signed)
Nutrition Consult/Follow-up  RD consult for assessment of nutrition requirements/status. Initial nutrition assessment completed 6/4. PO intake poor at 0-30% per flowsheet records. Chocolate Ensure Complete ordered -- patient does not like flavor. Nurse Tech to offer [employer] later today. Palliative Medicine Team note reviewed.  Diet Order:  Regular  Meds: Scheduled Meds:   . aliskiren  300 mg Oral Daily  . amLODipine  10 mg Oral Daily  . aspirin EC  81 mg Oral Daily  . atorvastatin  20 mg Oral QHS  . carvedilol  25 mg Oral BID  . cloNIDine  0.1 mg Oral BID  . docusate sodium  100 mg Oral BID  . enoxaparin  40 mg Subcutaneous QHS  . feeding supplement  237 mL Oral BID BM  . hydrochlorothiazide  25 mg Oral Daily  . LORazepam  1 mg Oral Once  . pantoprazole  40 mg Oral Q1200  . potassium chloride  40 mEq Oral BID  . sodium chloride  3 mL Intravenous Q12H  . Tamsulosin HCl  0.8 mg Oral Daily  . DISCONTD: cloNIDine  0.2 mg Oral BID   Continuous Infusions:  PRN Meds:.acetaminophen, acetaminophen, cyclobenzaprine, LORazepam  Labs:  CMP     Component Value Date/Time   NA 142 02/05/2012 0820   NA 144 12/16/2011 1142   K 4.2 02/05/2012 0820   K 3.3 12/16/2011 1142   CL 112 02/05/2012 0820   CL 103 12/16/2011 1142   CO2 23 02/05/2012 0820   CO2 29 12/16/2011 1142   GLUCOSE 98 02/05/2012 0820   GLUCOSE 128* 12/16/2011 1142   BUN 22 02/05/2012 0820   BUN 27* 12/16/2011 1142   CREATININE 1.42* 02/05/2012 0820   CREATININE 1.3* 12/16/2011 1142   CALCIUM 10.2 02/05/2012 0820   CALCIUM 9.4 12/16/2011 1142   PROT 5.3* 02/02/2012 1605   PROT 5.6* 12/16/2011 1142   ALBUMIN 3.1* 02/02/2012 1605   AST 8 02/02/2012 1605   AST 14 12/16/2011 1142   ALT <5 02/02/2012 1605   ALKPHOS 47 02/02/2012 1605   ALKPHOS 45 12/16/2011 1142   BILITOT 0.3 02/02/2012 1605   BILITOT 0.60 12/16/2011 1142   GFRNONAA 47* 02/05/2012 0820   GFRAA 55* 02/05/2012 0820     Intake/Output Summary (Last 24 hours) at 02/05/12 1150 Last data  filed at 02/05/12 0900  Gross per 24 hour  Intake    120 ml  Output      0 ml  Net    120 ml    Weight Status:  55 kg (6/5) -- stable  Re-estimated needs:  1700-1900 kcals, 80-90 gm protein  Nutrition Dx:  Unintentional Weight Loss, ongoing  Goal:  Oral intake to meet >90% of estimated nutrition needs, unmet Monitor: PO intake, weight, labs, I/O's  Intervention:    Continue Ensure Complete BID -- offer different flavor  RD to follow for nutrition care plan  Alger Memos Pager #:  [phone]

## 2012-02-05 NOTE — Progress Notes (Signed)
Pt had 11 beats of V-tach, pt feels ok, cont. monitor

## 2012-02-05 NOTE — Progress Notes (Signed)
I discussed with  Dr Booth.  I agree with their plans documented in their progress note for today.  

## 2012-02-05 NOTE — Progress Notes (Signed)
FMTS Daily Intern Progress Note  Subjective: Patient doing well this morning.  No pain.  Eating okay.  Remains agreeable to short-term (<1week) SNF for rehab.  I have reviewed the patient's medications.  Objective Temp:  [98.6 F (37 C)-98.9 F (37.2 C)] 98.6 F (37 C) (06/05 0617) Pulse Rate:  [58-69] 65  (06/05 0617) Resp:  [18] 18  (06/05 0617) BP: (150-169)/(82-91) 151/87 mmHg (06/05 0617) SpO2:  [99 %-100 %] 99 % (06/05 0617) Weight:  [121 lb 4.1 oz (55 kg)] 121 lb 4.1 oz (55 kg) (06/05 0617)   Intake/Output Summary (Last 24 hours) at 02/05/12 0906 Last data filed at 02/04/12 1000  Gross per 24 hour  Intake      0 ml  Output    650 ml  Net   -650 ml    CBG (last 3)  No results found for this basename: GLUCAP:3 in the last 72 hours  General: alert, NAD, very thin HEENT: torticollis, sclera white, MMM CV: RRR, no murmurs Ext: no edema Neuro: 5/5 strength in all extremities  Labs and Imaging  Lab 02/04/12 0848 02/03/12 0640 02/02/12 1605  WBC 6.1 7.3 7.4  HGB 10.0* 9.5* 10.3*  HCT 29.4* 27.6* 29.6*  PLT 211 213 224     Lab 02/04/12 0635 02/03/12 2013 02/03/12 0640  NA 144 145 143  K 3.9 3.9 3.7  CL 112 113* 111  CO2 17* 24 22  BUN 22 24* 24*  CREATININE 1.55* 1.65* 1.63*  LABGLOM -- -- --  GLUCOSE 103* -- --  CALCIUM 9.6 9.8 9.6    Assessment and Plan [name] is a 77 y.o. year old male presenting with weakness and falls.   # Weakness and falls: Likely multifactorial with contributions from possible subacute stroke, deconditioning secondary to malignancy, possible new brain malignancy not imaged on head CT, electrolyte abnormalities.  Orthostatics normal form lying to sitting, unable to assess standing BP. - PT/OT recommendations: SNF - consult nutrition  # Hypokalemia: Patient significantly potassium depleted. This may be secondary to nutritional status versus effect of malignancy. Magnesium level okay.  Resolved. - continue K-Dur  BID - f/u BMP  # Lung cancer: Currently stage IV with metastatic disease. Currently stable. Discussed with [name] who states patient currently on a wait and see management for new RLL nodule. - f/u goals of care meeting: DNR/DNI - given family concern about new CT finding, spoke with oncologist and radiation oncologist who states that if we can get an MRI before discharge that would be helpful  # Hypertension: On multiple medications. Plan to taper back some medications to prevent presumed orthostatic hypotension.  - will taper down clonidine due to concern for orthostatic hypotension  **decrease to 0.2mg  BID 6/3, 0.1mg  BID on 6/5, 0.1mg  qAM on 6/7, d/c on 6/9  # Anemia: likely secondary to chronic disease.  - will continue to monitor  # Social: Perhaps the most concerning issue today. [name] likely will require 24-hour supervision but his family will not be able to do that. Plan for social work consult along with physical therapy consult. May benefit from hospice services.  - goals of care meeting: DNR/DNI, palliative care will continue to follow - appreciate SW involvement  # Acute on chronic renal failure: Patient has a mild increase of creatinine. This is likely secondary to mild dehydration with renin angiotensin pathway blockage vs urinary retention. Plan to use fluid rehydration and follow.  Resolved.   # Urinary retention: Foley  placed 6/2 with return of 600cc urine.  Chronic catheter removed 1 week ago. - will d/c doxazosin due to falls and concern for orthostatic hypotension - continue flomax to 0.8mg  daily - will d/c with foley in place; follow up with [name]   FEN/GI: SLIV, cardiac diet PPx: SQ heparin Disposition: d/c after MRI and OT recommendations   [name] Pager: [phone] 02/05/2012, 9:06 AM

## 2012-02-05 NOTE — Progress Notes (Signed)
PT Cancellation Note  Treatment cancelled today due to patient's refusal to participate. He states he does not want to walk until his walker from home arrives. Attempted x 2. Will check back at later time/date.  Edwyna Perfect, PT  Pager [phone]  02/05/2012, 8:45 AM

## 2012-02-05 NOTE — Progress Notes (Signed)
Clinical Child psychotherapist (CSW) provided pt daughter with bed offers (per pt request to update daughter) for pt snf placement. Daughter has chosen placement at [facility]. CSW informed facility. CSW to facilitate with transfer when pt dc'ed.  Theresia Bough, MSW, Theresia Majors [phone]

## 2012-02-06 DIAGNOSIS — R5383 Other fatigue: Secondary | ICD-10-CM

## 2012-02-06 DIAGNOSIS — F29 Unspecified psychosis not due to a substance or known physiological condition: Secondary | ICD-10-CM

## 2012-02-06 DIAGNOSIS — R5381 Other malaise: Secondary | ICD-10-CM

## 2012-02-06 DIAGNOSIS — R627 Adult failure to thrive: Secondary | ICD-10-CM

## 2012-02-06 LAB — BASIC METABOLIC PANEL
GFR calc Af Amer: 58 mL/min — ABNORMAL LOW (ref >90)
GFR calc non Af Amer: 50 mL/min — ABNORMAL LOW (ref >90)
Glucose, Bld: 87 mg/dL (ref 70–99)
Potassium: 3.9 mEq/L (ref 3.5–5.1)
Sodium: 140 mEq/L (ref 135–145)

## 2012-02-06 MED ORDER — ASPIRIN 81 MG PO TBEC: 81.0000 mg | DELAYED_RELEASE_TABLET | Freq: Every day | ORAL | Status: AC

## 2012-02-06 MED ORDER — POTASSIUM CHLORIDE CRYS ER 20 MEQ PO TBCR: 40.0000 meq | EXTENDED_RELEASE_TABLET | Freq: Every day | ORAL | Status: AC

## 2012-02-06 MED ORDER — CLONIDINE HCL 0.1 MG PO TABS: 0.1000 mg | ORAL_TABLET | Freq: Two times a day (BID) | ORAL | Status: AC

## 2012-02-06 MED ORDER — ENSURE COMPLETE PO LIQD: 237.0000 mL | Freq: Two times a day (BID) | ORAL | Status: AC

## 2012-02-06 NOTE — Progress Notes (Signed)
Physical Therapy Treatment Patient Details Name: [name] MRN: [id number] DOB: Jan 13, 1935 Today's Date: 02/06/2012 Time: 4098-1191 PT Time Calculation (min): 12 min  PT Assessment / Plan / Recommendation Comments on Treatment Session  Excellent gains in funtional mobility and safety with gait.     Follow Up Recommendations  Skilled nursing facility    Barriers to Discharge  Decreased caregiver support for home discharge      Equipment Recommendations  Defer to next venue    Recommendations for Other Services  None  Frequency Min 3X/week   Plan Discharge plan remains appropriate;Frequency remains appropriate    Precautions / Restrictions Precautions Precautions: Fall   Pertinent Vitals/Pain VSS/ no pain reported.    Mobility  Bed Mobility Supine to Sit: 3: Mod assist Sitting - Scoot to Edge of Bed: 5: Supervision Sit to Supine: 5: Supervision Scooting to [facility]: 5: Supervision Details for Bed Mobility Assistance: Patient asks to be assisted to sitting, but feel that patient is capable Transfers Sit to Stand: 4: Min guard;With upper extremity assist;From bed Stand to Sit: 4: Min guard;With upper extremity assist;To bed Details for Transfer Assistance: Improved safety with initiation. Ambulation/Gait Ambulation/Gait Assistance: 4: Min guard Ambulation Distance (Feet): 150 Feet Assistive device: 4-wheeled walker Ambulation/Gait Assistance Details: Stability with gait vastly improved. He does not pay too much attention to his envirnoment and would walk into people if he was not cued.  Gait Pattern: Step-through pattern Gait velocity: Significantly improved gait speed and stride length.     PT Goals Acute Rehab PT Goals PT Goal: Supine/Side to Sit - Progress: Progressing toward goal PT Goal: Sit to Supine/Side - Progress: Met PT Goal: Sit to Stand - Progress: Progressing toward goal PT Goal: Stand to Sit - Progress: Progressing toward goal PT Goal: Ambulate -  Progress: Progressing toward goal  Visit Information  Last PT Received On: 02/06/12 Assistance Needed: +1    Subjective Data  Subjective: Patient reports "I don't know what you want but I'll walk down the hill and back"   Cognition  Overall Cognitive Status: Impaired Area of Impairment: Memory;Safety/judgement;Problem solving Orientation Level: Disoriented to;Situation Behavior During Session: WFL for tasks performed Memory: Decreased recall of precautions Safety/Judgement: Decreased awareness of safety precautions Cognition - Other Comments: Significant hearing loss is also a limiting factor in his comprehension.    Balance  High Level Balance High Level Balance Activites: Turns;Direction changes High Level Balance Comments: Close guarding with dynamic balance at support of walker - no physical assistance provided today.  End of Session PT - End of Session Equipment Utilized During Treatment: Gait belt Activity Tolerance: Patient tolerated treatment well Patient left: in bed;with call bell/phone within reach;with bed alarm set Nurse Communication: Mobility status    Edwyna Perfect, PT  Pager [phone]  02/06/2012, 3:05 PM

## 2012-02-06 NOTE — Progress Notes (Signed)
Clinical Child psychotherapist (CSW) confirmed pt ready for [employer] to [facility]. CSW submitted pt dc summary and AVS to facility via [facility]. CSW provided RN with contact number to provide RN report, placed dc packet in pt wall-a-roo, contacted [facility] for a 15:45 transport and informed pt daughter who will sign pt in today. Daughter appreciative and agreeable, pt aware and also agreeable with disposition. No further CSW needs. CSW signing off.  Theresia Bough, MSW, Theresia Majors [phone]

## 2012-02-06 NOTE — Discharge Summary (Signed)
Discharge Summary 02/06/2012 2:06 PM  [name] DOB: Jan 01, 1935 MRN: [id number]  Date of Admission: 02/02/2012 Date of Discharge: 02/06/2012  PCP: Rodney Shaggy, MD, MD Consultants: none  Reason for Admission: weakness  Discharge Diagnosis Primary 1. Weakness, likely secondary to malignancy and deconditioning 2. Hypokalemia, resolved 3. Acute on chronic kidney injury, resolved 4. Urinary retention Secondary 1. Lung Cancer, stage IV 2. CAD 3. HTN 4. Anemia  Hospital Course: [name] is a 77 y.o. year old male with stage IV lung cancer presenting with weakness and falls.  1. Weakness: Likely multifactorial with possible subacute stroke vs new brain metastases, deconditioning, orthostatic hypotension, and electrolyte abnormalities. Head CT concerning for possible subacute stroke.  His potassium was repleted and he reported increased strength in his lower extremities.  PT and OT evaluated him and recommended SNF placement.  Multiple conversations were had with the patient, family, social work, and palliative care.  He did eventually come around to short term admission for rehab.  He also made the decision to be DNR/DNI.  2. Hypokalemia: Likely secondary to poor PO intake vs malignancy.  Resolved with oral repletion.  Will keep him on of KDur daily.  3. Acute on chronic kidney injury: Likely secondary to a combination of dehydration and urinary retention.  Improved with rehydration and placement of Foley catheter.  4. Urinary retention: Did have a chronic catheter that had been removed 1 week prior to admission.  On hospital day one, he complained of urinary retention and a Foley was placed with return of 600cc of urine.  His home doxazosin was discontinued due to concerns of orthostatic hypotension.  His home flomax was continued and increased to 0.8mg  daily.  Spoke with his urologist via phone who recommended discharge with foley in place and outpatient follow up.  5.  Lung cancer, stage IV: Possible new metastases to the brain on MRI.  Spoke with his oncologist over the phone who has a follow up with him in July.  Met with palliative care who recommends continued palliative care services at SNF.  He is now DNR/DNI.  6. CAD: Stable.  Started on aspirin 81mg  daily.  7. Hypertension: Blood pressure has varied some during this admission; at discharge it is stable in the 150s-160s systolic.  Orthostatic vitals were attempted; however, patient was unable to stand for them.  We tapered his clonidine (will change to 0.1mg  daily on 6/7 with last dose on 6/8) and stopped the doxazosin due to concerns for orthostatic hypotension.  8. Anemia: Likely of chronic disease.  Asymptomatic and stable with a hemoglobin around 10.  Discharge Exam: Temp:  [98.5 F (36.9 C)-98.7 F (37.1 C)] 98.7 F (37.1 C) (06/06 0613) Pulse Rate:  [62-74] 68  (06/06 1403) Resp:  [17-18] 18  (06/06 0613) BP: (137-172)/(75-91) 137/75 mmHg (06/06 1403) SpO2:  [98 %-99 %] 99 % (06/06 0613) Weight:  [124 lb 5.4 oz (56.4 kg)] 124 lb 5.4 oz (56.4 kg) (06/06 4098)  Gen: sleeping, easily arousable HEENT: AT/[place], torticollis CV: RRR, no murmurs Pulm: CTAB Ext: no edema  Procedures: none  Discharge Medications Medication List  As of 02/06/2012  2:06 PM   START taking these medications         aspirin 81 MG EC tablet   Take 1 tablet (81 mg total) by mouth daily.      feeding supplement Liqd   Take 237 mLs by mouth 2 (two) times daily between meals.      potassium  chloride SA 20 MEQ tablet   Commonly known as: K-DUR,KLOR-CON   Take 2 tablets (40 mEq total) by mouth daily.         CHANGE how you take these medications         cloNIDine 0.1 MG tablet   Commonly known as: CATAPRES   Take 1 tablet (0.1 mg total) by mouth 2 (two) times daily. On 02/07/12 change to 0.1mg  once daily for two days then stop taking   What changed: - medication strength - dose - doctor's instructions           CONTINUE taking these medications         amLODipine 10 MG tablet   Commonly known as: NORVASC      carvedilol 25 MG tablet   Commonly known as: COREG      cyclobenzaprine 10 MG tablet   Commonly known as: FLEXERIL      docusate sodium 100 MG capsule   Commonly known as: COLACE      FLOMAX 0.4 MG Caps   Generic drug: Tamsulosin HCl      hydrochlorothiazide 25 MG tablet   Commonly known as: HYDRODIURIL      LORazepam 0.5 MG tablet   Commonly known as: ATIVAN      pantoprazole 40 MG tablet   Commonly known as: PROTONIX      simvastatin 40 MG tablet   Commonly known as: ZOCOR      TEKTURNA 300 MG tablet   Generic drug: aliskiren         STOP taking these medications         doxazosin 1 MG tablet      potassium chloride 10 MEQ tablet          Where to get your medications    These are the prescriptions that you need to pick up. We sent them to a specific pharmacy, so you will need to go there to get them.   [facility] - [place], [place] - [address]    [address] [place] [address]    Phone: [phone]        aspirin 81 MG EC tablet   cloNIDine 0.1 MG tablet   feeding supplement Liqd   potassium chloride SA 20 MEQ tablet            Pertinent Hospital Labs  Lab 02/04/12 0848 02/03/12 0640 02/02/12 1605  WBC 6.1 7.3 7.4  HGB 10.0* 9.5* 10.3*  HCT 29.4* 27.6* 29.6*  PLT 211 213 224    Lab 02/06/12 0730 02/05/12 0820 02/04/12 0635  NA 140 142 144  K 3.9 4.2 3.9  CL 109 112 112  CO2 24 23 17*  BUN 23 22 22   CREATININE 1.35 1.42* 1.55*  LABGLOM -- -- --  GLUCOSE 87 -- --  CALCIUM 10.2 10.2 9.6   Ammonia: wnl  UCx: no growth  CXR: No acute disease.  Head CT: 1. Interval development of a small area of low attenuation in the  in the right corona radiata, suspicious for an area of subacute  ischemia. Otherwise, the appearance of brain is similar to the  prior examination, as detailed above (within the limitations of  this  exam which is compromised by extensive patient motion).  2. Bilateral mastoid effusions and extensive paranasal sinus  disease redemonstrated.,  Brain MRI: Significantly limited examination secondary to marked motion  degradation. Abnormality involving the cerebellum raises  possibility of small acute infarcts. Metastatic disease  not  entirely excluded.  Postsurgical changes posterior right frontal - parietal lobe where  the patient has had a craniotomy. Increase in extra-axial space  right hemisphere when compared to the preoperative exam. This may  represent a small chronic subdural collection related to the  patient's prior surgery.  Mastoid air cell, middle ear cavity and paranasal sinus significant  opacification.  Discharge instructions: see AVS  Condition at discharge: stable  Disposition: SNF  Pending Tests: none  Follow up: Follow-up Information    Follow up with TATE,DENNY C, MD. (make an appointment 1-2 weeks after leaving nursing home)    Contact information:   [address]   [place] [address] [phone]          Follow up Issues:  1. Rehab per PT/OT 2. We are tapering down his clonidine. - on 6/6 0.1mg  BID - on 6/7 0.1mg  daily - on 6/8 0.1mg  daily - on 6/9 discontinue  BOOTH, Sharai Overbay 02/06/2012, 2:06 PM

## 2012-02-06 NOTE — Care Management Note (Signed)
    Page 1 of 1   02/06/2012     3:01:55 PM   CARE MANAGEMENT NOTE 02/06/2012  Patient:  [name], [name]   Account Number:  [id number]  Date Initiated:  02/06/2012  Documentation initiated by:  GRAVES-BIGELOW,Celines Femia  Subjective/Objective Assessment:   Pt admitted with weakness, hypokalemia. Pt is from home with family.     Action/Plan:   Plan for d/c today to [facility].   Anticipated DC Date:  02/06/2012   Anticipated DC Plan:  SKILLED NURSING FACILITY  In-house referral  Clinical Social Worker      DC Planning Services  CM consult      Choice offered to / List presented to:             Status of service:  Completed, signed off Medicare Important Message given?   (If response is "NO", the following Medicare IM given date fields will be blank) Date Medicare IM given:   Date Additional Medicare IM given:    Discharge Disposition:  SKILLED NURSING FACILITY  Per UR Regulation:  Reviewed for med. necessity/level of care/duration of stay  If discussed at Long Length of Stay Meetings, dates discussed:    Comments:

## 2012-02-06 NOTE — Discharge Summary (Signed)
I have seen and examined this patient. I have discussed with Dr Elwyn Reach.  I agree with their findings and plans as documented in their discharge note for today. Acute Issues 1. Generalized weakness and muscle disuse atrophy 2. Hypokalemia 3. Urinary retention 4. Protein-caloric malnutrition, severe.

## 2012-02-06 NOTE — Progress Notes (Signed)
Patient ZO:XWRUEA [name]      DOB: 06-Jun-1935      VWU:[id number]   Palliative Medicine Team at [facility] Progress Note  Subjective: Patient resting comfortably    Filed Vitals:   02/06/12 0909  BP: 163/87  Pulse: 74  Temp:   Resp:    Physical exam:  General: Cachectic, alert, but very HOH  HEENT: R eye prosthesis, L eye anicteric, mouth moist, dentition poor  Chest: CTA bilaterally, diminished at bases  CVS: RRR, no MGR  Abdomen: Soft , non-distended, BS audible  Extrem's: no pedal edema  Neuro: per daughter patient is intermittently confused   Assessment and plan: 77 yo WM admitted 02/02/12 from home due to leg weakness and falls at home. Diagnosed with NSCLC Stg 1B in 2011, s/p RUL lobectomy 04/10/10. 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 Now with intermittenrt confusion,  MRI completed 6/5 cerebellar abnormality non-confirmative for metastatic involvement.  1. Code Status:DNR/DNI 2. Symptom Control: -patient comfortable at present  3. Psycho/Social: emotional support to patient and family 4. Spiritual: consult placed 5. Disposition: decision for discharge to [facility] SNF, unfortunately Palliative Care Services from [facility] are not available there. Contact daughter Crystal and inform her if fathers condition declines she can acquire hospice services after initial period of stay (generally 21 days), at which point medicare would pay for hospice services.  Time In Time Out Total Time Spent with Patient Total Overall Time  11:00a 11:30a 15 min 30 min   Greater than 50%  of this time was spent counseling and coordinating care related to the above assessment and plan.  Freddie Breech, CNS-C Palliative Medicine Team [facility] Team Phone: [phone] Pager: [phone]

## 2012-02-06 NOTE — Discharge Instructions (Signed)
You were admitted for some weakness.  You had some electrolytes abnormalities.  These were corrected.

## 2012-02-07 ENCOUNTER — Telehealth: Payer: Self-pay | Admitting: *Deleted

## 2012-02-07 NOTE — Telephone Encounter (Signed)
Pt's daughter Aggie Cosier called stating that Pt was in the hospital x 4 days due to weakness and they are determining whether he had a stroke vs. Brain mets.  She wanted to know what Dr Donnald Garre thinks.  Pt has been moved to a rehab facility.  Informed Crystal that Dr Donnald Garre is out of the office but will talk to him Monday and call back, she verbalized understanding.  SLJ

## 2012-02-10 ENCOUNTER — Telehealth: Payer: Self-pay | Admitting: Radiation Oncology

## 2012-02-10 ENCOUNTER — Encounter: Payer: Self-pay | Admitting: *Deleted

## 2012-02-10 NOTE — Telephone Encounter (Signed)
Spoke with the patient's daughter, Aggie Cosier, today about his MRI results. Acknowledged there was a lot of motion causing significant artifact on the study. I explained there is no obvious evidence of cancer recurrence in the brain. She reported that her father remains confused and she does not know why. I told her that there is a possibility that this is due to infarcts, though it is hard to know for sure. He is not eating very well. She wonders if he should go on hospice or keep his options open in case he needs further treatment for his cancer. We spoke about plans of care from this point forward. Ultimately, I said that it would be appropriate for him to remain in the skilled nursing facility and to followup with his primary doctor 1-2 weeks after discharge from the nursing facility if /when he is well enough to be discharged. This is according to the recommendations of the patient's hospitalists on his discharge summary. I also encouraged the patient to followup with medical oncology as scheduled. If he is doing better by then, and is still a candidate for stereotactic body radiation to his lung, I would be happy to see him again to discuss that further. Unfortunately, he seems to have a grave prognosis due to his current failure to thrive. I am always happy to meet with them the answer questions or to discuss things over the phone. .She seemed satisfied with this plan.

## 2012-02-10 NOTE — Progress Notes (Signed)
Dr Donnald Garre reviewed pt's chart, per Dr Donnald Garre, pt needs to f/u with Dr Basilio Cairo regarding questionable brain mets vs. [place].  Crystal, pt's daughter verbalized understanding.  SLJ

## 2012-03-23 ENCOUNTER — Other Ambulatory Visit (HOSPITAL_COMMUNITY): Payer: Medicare Other

## 2012-03-23 ENCOUNTER — Other Ambulatory Visit: Payer: Medicare Other | Admitting: Lab

## 2012-03-24 ENCOUNTER — Telehealth: Payer: Self-pay | Admitting: Medical Oncology

## 2012-03-24 NOTE — Telephone Encounter (Signed)
Pt missed CT scan .  I left message for his daughter to call

## 2012-03-25 ENCOUNTER — Telehealth: Payer: Self-pay | Admitting: Medical Oncology

## 2012-03-25 NOTE — Telephone Encounter (Signed)
Pt had a light stroke and he is in [facility] nursing home . He is weak, but able to walk  . The stroke affected his " mind" with memory loss , speech inappropriate.  The family cannot take care of him. I told Crystal I will cancel his appointment and call her back after consulting with Dr Donnald Garre.

## 2012-03-25 NOTE — Telephone Encounter (Signed)
Unable to reach daughter re no show for ct scan

## 2012-03-26 ENCOUNTER — Ambulatory Visit: Payer: Medicare Other | Admitting: Internal Medicine

## 2012-04-10 ENCOUNTER — Telehealth: Payer: Self-pay | Admitting: *Deleted

## 2012-04-10 NOTE — Telephone Encounter (Signed)
Chart review.

## 2012-04-10 NOTE — Telephone Encounter (Signed)
1500--Patient daughter calling back to finalize/put closure to previous call in July. Patient continues to deteriorate, down to 100pounds, basically bedridden, has to use walker.  She wants to make sure that Dr Arbutus Ped is aware and she is not holding patient back from any treatment that may help him for her own "peace of mind".  Informed Crystal that I would get feed back from Dr Arbutus Ped and we would call her back when he reviews and if he has any further input on situation. She looks forward to our return call.

## 2012-04-16 ENCOUNTER — Other Ambulatory Visit: Payer: Self-pay | Admitting: *Deleted

## 2012-04-18 ENCOUNTER — Emergency Department: Payer: Self-pay | Admitting: Internal Medicine

## 2012-04-27 ENCOUNTER — Telehealth: Payer: Self-pay | Admitting: *Deleted

## 2012-04-27 LAB — COMPREHENSIVE METABOLIC PANEL
Albumin: 2.1 g/dL — ABNORMAL LOW (ref 3.4–5.0)
Anion Gap: 7 (ref 7–16)
BUN: 33 mg/dL — ABNORMAL HIGH (ref 7–18)
Calcium, Total: 9.7 mg/dL (ref 8.5–10.1)
Chloride: 112 mmol/L — ABNORMAL HIGH (ref 98–107)
Co2: 26 mmol/L (ref 21–32)
EGFR (African American): 44 — ABNORMAL LOW
EGFR (Non-African Amer.): 38 — ABNORMAL LOW
Glucose: 122 mg/dL — ABNORMAL HIGH (ref 65–99)
Osmolality: 297 (ref 275–301)
Potassium: 4.1 mmol/L (ref 3.5–5.1)
SGOT(AST): 20 U/L (ref 15–37)
SGPT (ALT): 17 U/L (ref 12–78)
Total Protein: 5.2 g/dL — ABNORMAL LOW (ref 6.4–8.2)

## 2012-04-27 LAB — CBC WITH DIFFERENTIAL/PLATELET
Basophil #: 0 10*3/uL (ref 0.0–0.1)
Eosinophil #: 0.1 10*3/uL (ref 0.0–0.7)
Eosinophil %: 1 %
HGB: 6.4 g/dL — ABNORMAL LOW (ref 13.0–18.0)
MCH: 29.2 pg (ref 26.0–34.0)
MCHC: 32.8 g/dL (ref 32.0–36.0)
MCV: 89 fL (ref 80–100)
Monocyte %: 4.9 %
Neutrophil #: 10.8 10*3/uL — ABNORMAL HIGH (ref 1.4–6.5)
Platelet: 325 10*3/uL (ref 150–440)
RBC: 2.18 10*6/uL — ABNORMAL LOW (ref 4.40–5.90)
WBC: 11.8 10*3/uL — ABNORMAL HIGH (ref 3.8–10.6)

## 2012-04-27 NOTE — Telephone Encounter (Signed)
Called and spoke with pt's daughter Crystal.  She said pt is worse and she does not feel at this point he would be able to come in for a CT scan or to the office to be seen.  She said he is in a nursing home and very confused.  They were never able to determine whether he had a stroke or if its the brain tumor.  She said Dr Basilio Cairo thinks it was probably a stroke.  Asked Crystal to keep in touch with [place] so if he has improvement we can schedule his lab/CT and f/u with [facility] (pt had been on 3 month f/u).   She verbalized understanding.  SLJ

## 2012-04-28 ENCOUNTER — Ambulatory Visit: Payer: Self-pay | Admitting: Internal Medicine

## 2012-04-28 ENCOUNTER — Inpatient Hospital Stay: Payer: Self-pay | Admitting: Internal Medicine

## 2012-04-28 LAB — PROTIME-INR: Prothrombin Time: 15.5 secs — ABNORMAL HIGH (ref 11.5–14.7)

## 2012-04-28 LAB — CBC WITH DIFFERENTIAL/PLATELET
Eosinophil #: 0.3 10*3/uL (ref 0.0–0.7)
Eosinophil %: 3.9 %
HGB: 8.8 g/dL — ABNORMAL LOW (ref 13.0–18.0)
Lymphocyte %: 4.8 %
MCH: 29.4 pg (ref 26.0–34.0)
MCHC: 33.3 g/dL (ref 32.0–36.0)
MCV: 88 fL (ref 80–100)
Monocyte #: 0.7 x10 3/mm (ref 0.2–1.0)
Neutrophil %: 81.4 %
Platelet: 260 10*3/uL (ref 150–440)
RBC: 3 10*6/uL — ABNORMAL LOW (ref 4.40–5.90)
RDW: 15 % — ABNORMAL HIGH (ref 11.5–14.5)

## 2012-04-28 LAB — URINALYSIS, COMPLETE
Bacteria: NONE SEEN
Bilirubin,UR: NEGATIVE
Glucose,UR: NEGATIVE mg/dL (ref 0–75)
Ketone: NEGATIVE
Nitrite: NEGATIVE
Specific Gravity: 1.01 (ref 1.003–1.030)
WBC UR: 56 /HPF (ref 0–5)

## 2012-04-28 LAB — BASIC METABOLIC PANEL
BUN: 32 mg/dL — ABNORMAL HIGH (ref 7–18)
Calcium, Total: 9.2 mg/dL (ref 8.5–10.1)
Chloride: 110 mmol/L — ABNORMAL HIGH (ref 98–107)
Glucose: 82 mg/dL (ref 65–99)
Potassium: 4.3 mmol/L (ref 3.5–5.1)
Sodium: 145 mmol/L (ref 136–145)

## 2012-04-28 LAB — APTT: Activated PTT: 36.6 secs — ABNORMAL HIGH (ref 23.6–35.9)

## 2012-04-29 LAB — COMPREHENSIVE METABOLIC PANEL
Albumin: 2 g/dL — ABNORMAL LOW (ref 3.4–5.0)
Alkaline Phosphatase: 60 U/L (ref 50–136)
BUN: 33 mg/dL — ABNORMAL HIGH (ref 7–18)
Chloride: 109 mmol/L — ABNORMAL HIGH (ref 98–107)
EGFR (African American): 55 — ABNORMAL LOW
EGFR (Non-African Amer.): 47 — ABNORMAL LOW
Glucose: 76 mg/dL (ref 65–99)
Potassium: 4 mmol/L (ref 3.5–5.1)
SGOT(AST): 16 U/L (ref 15–37)
SGPT (ALT): 16 U/L (ref 12–78)

## 2012-04-29 LAB — CBC WITH DIFFERENTIAL/PLATELET
Basophil %: 0.4 %
Eosinophil #: 0.4 10*3/uL (ref 0.0–0.7)
HCT: 27.1 % — ABNORMAL LOW (ref 40.0–52.0)
HGB: 9.4 g/dL — ABNORMAL LOW (ref 13.0–18.0)
Lymphocyte %: 8.7 %
MCHC: 34.5 g/dL (ref 32.0–36.0)
Monocyte %: 10.9 %
Neutrophil #: 5 10*3/uL (ref 1.4–6.5)
Neutrophil %: 74.3 %
Platelet: 278 10*3/uL (ref 150–440)
RBC: 3.11 10*6/uL — ABNORMAL LOW (ref 4.40–5.90)
WBC: 6.7 10*3/uL (ref 3.8–10.6)

## 2012-04-30 LAB — URINE CULTURE

## 2012-05-02 LAB — CULTURE, BLOOD (SINGLE)

## 2012-05-03 ENCOUNTER — Ambulatory Visit: Payer: Self-pay | Admitting: Internal Medicine

## 2012-05-03 LAB — CULTURE, BLOOD (SINGLE)

## 2012-07-03 DEATH — deceased

## 2012-07-12 IMAGING — CT CT HEAD WO/W CM
4 of 6 series · 14 of 30 positions shown, 16 images · IV contrast (omnipaque)
Comparison: MRI 09/30/2011.  MRI 08/21/2011.  Head CT 08/20/2011.

CLINICAL DATA: Cancer.  Restaging.

CT HEAD WITHOUT AND WITH CONTRAST
TECHNIQUE: Contiguous axial images were obtained from the base of
the skull through the vertex without and with intravenous contrast.
Contrast: 80mL OMNIPAQUE IOHEXOL 300 MG/ML  SOLN

[Series 3: head_seq 4.5 h37s st · axial · 0.43mm/px · z∈[+1114,+1197]mm · 3 of 36 slices shown]
[im 9/36  brain]
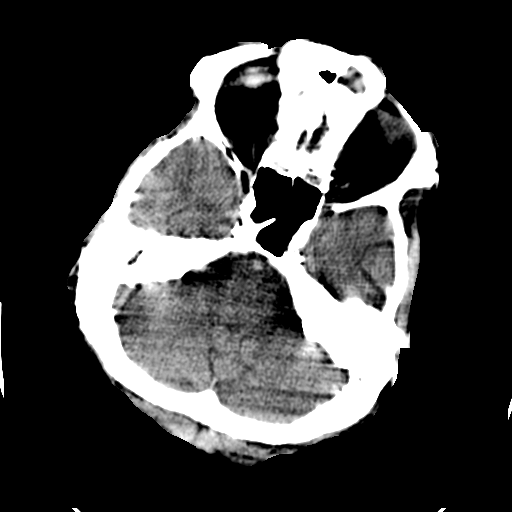
[im 18/36  brain]
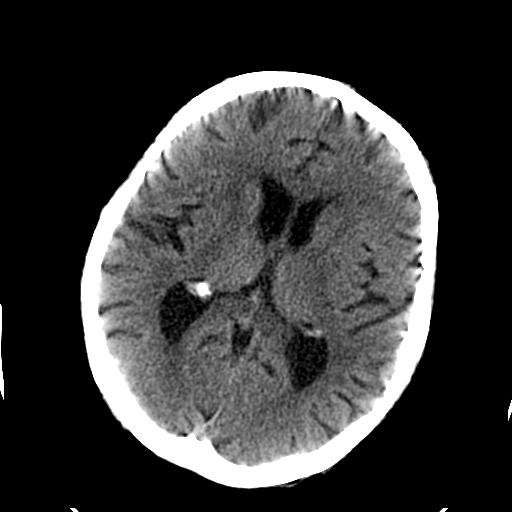
[im 27/36  brain]
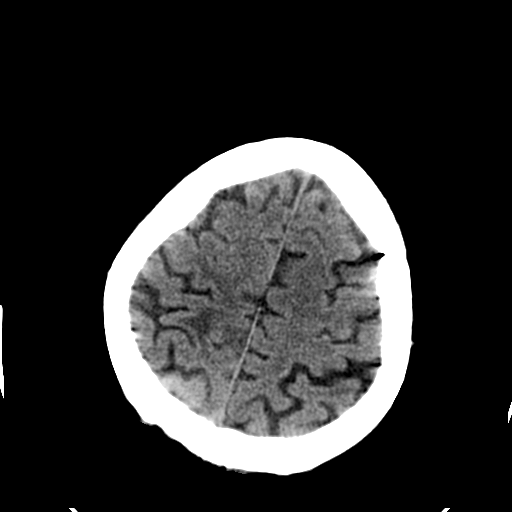

[Series 4: head_spiral 5.0 h37s st · axial · 0.39mm/px · z∈[+1089,+1144]mm · 2 of 35 slices shown (1 of 2)]
[im 12/35  brain]
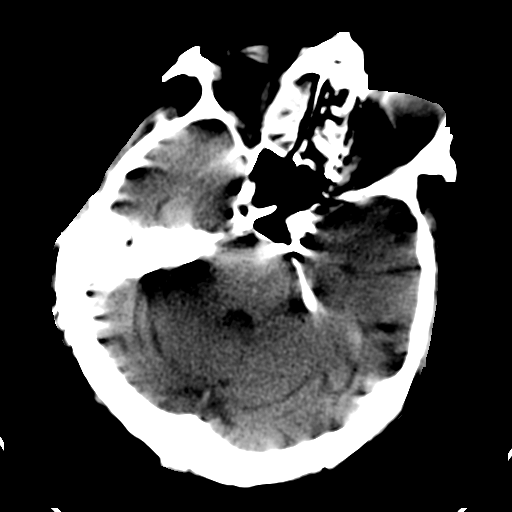
[im 23/35  brain]
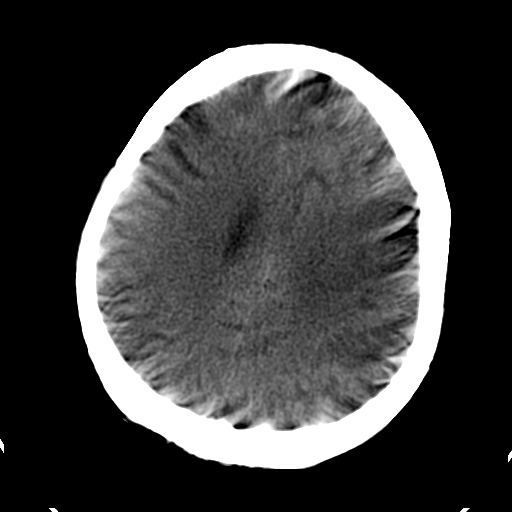

[Series 9: rtn chest with st · axial · 0.70mm/px · z∈[+637,+912]mm · 7 of 75 slices shown, 9 images]
[im 10/75  brain]
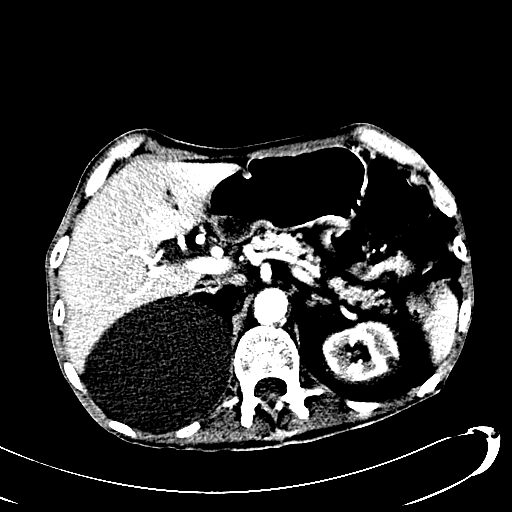
[im 10/75  bone]
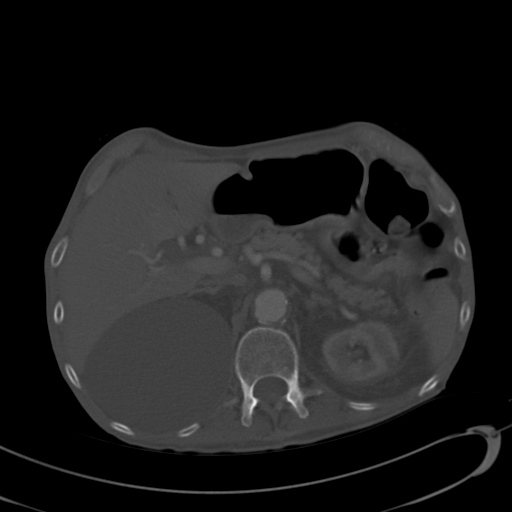
[im 19/75  brain]
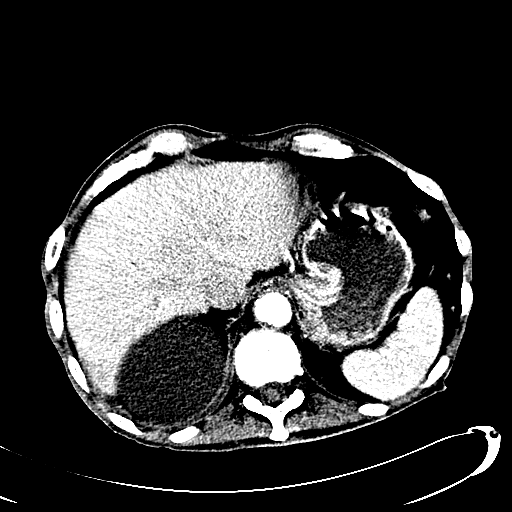
[im 28/75  brain]
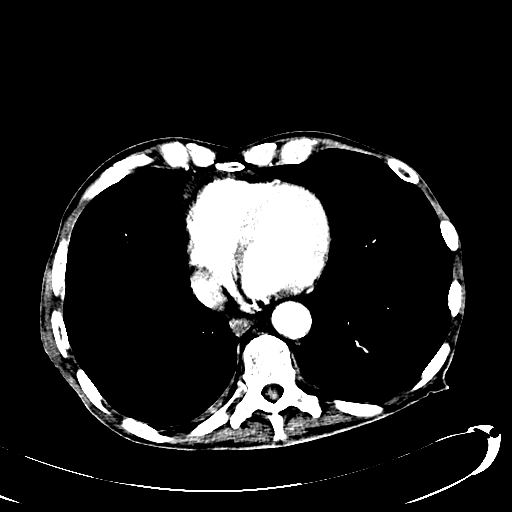
[im 38/75  brain]
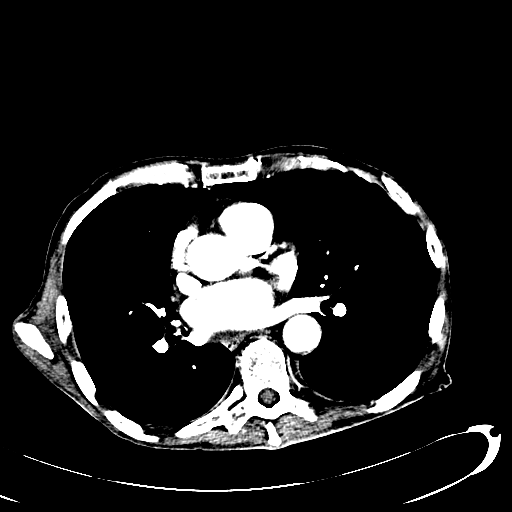
[im 47/75  brain]
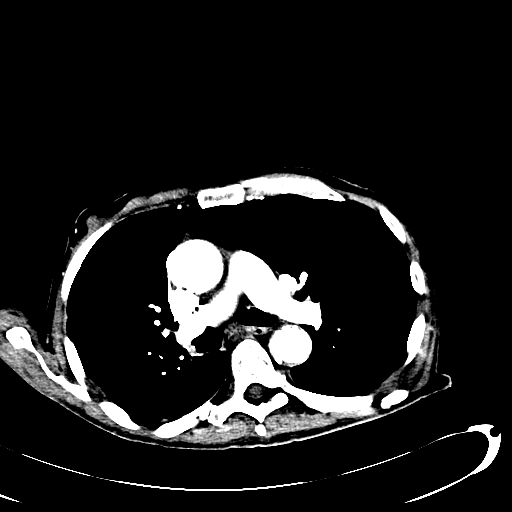
[im 47/75  bone]
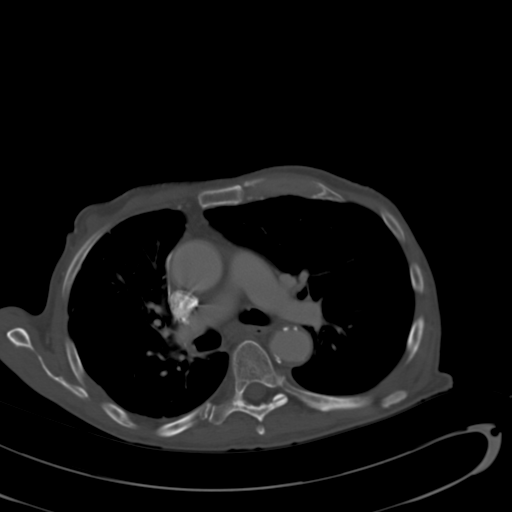
[im 56/75  brain]
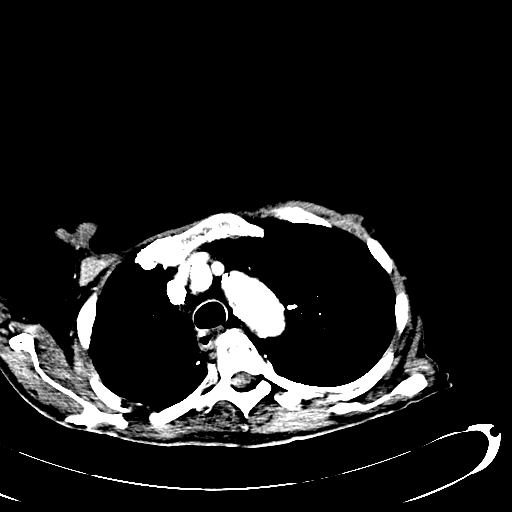
[im 65/75  brain]
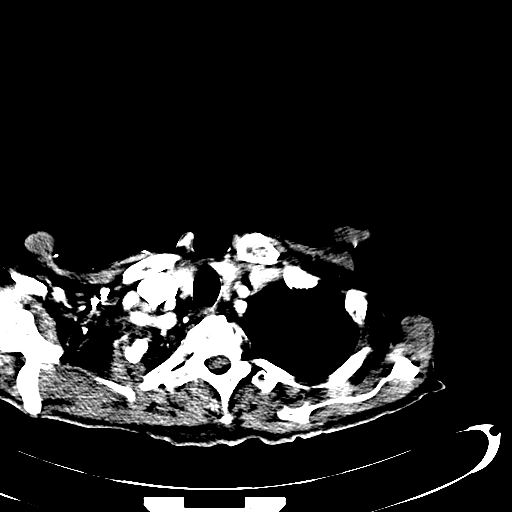

[Series 12: head_spiral 5.0 h37s st · axial · 0.39mm/px · z∈[+1089,+1144]mm · 2 of 35 slices shown (2 of 2)]
[im 12/35  brain]
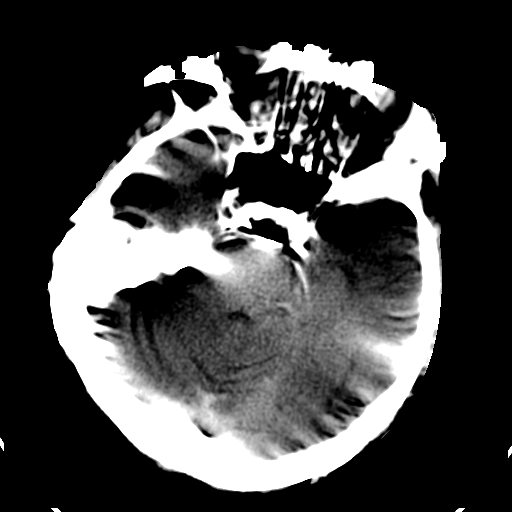
[im 23/35  brain]
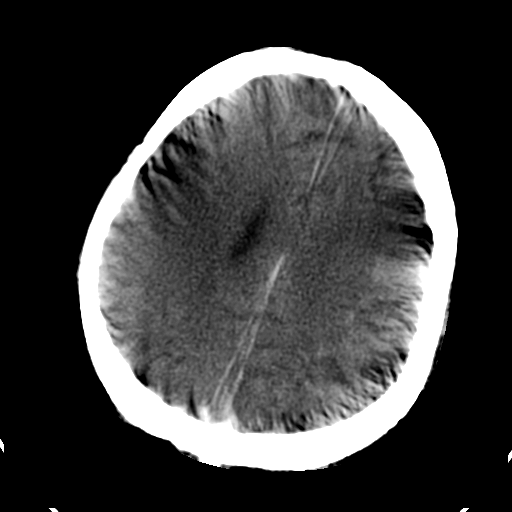

[14 of 30 positions shown; findings below may reference images not displayed]

FINDINGS: The study suffers from motion degradation.  Multiple
repeat attempts were carried out.

Lesion in the right parietal cortical and subcortical area is
smaller, estimated at 12 mm in diameter, with some calcification.
Adjacent white matter shows mild low density which could represent
minimal edema or gliosis.  No mass effect or shift.  No second
lesion is identified.  No hydrocephalus.  No evidence of ischemic
infarction.  Changes of previous craniotomy are as expected.
Maxillary sinuses are opacified.  Mastoid air cells are opacified.
IMPRESSION: Markedly degraded by motion.

Solitary brain abnormality in the right parietal cortical and
subcortical brain measuring approximately 12 mm in size, with some
internal calcification.  Mild adjacent low density in the white
matter could be gliosis or mild edema.  The appearance represents
an improvement since all prior imaging.  No worsening or new
abnormality is seen.

## 2014-12-20 NOTE — H&P (Signed)
PATIENT NAME:  [name], [name] MR#:  [id number] DATE OF BIRTH:  09-17-1934  DATE OF ADMISSION:  04/28/2012  REFERRING PHYSICIAN:  Dr. Mariea Clonts PRIMARY CARE PHYSICIAN:  Dr. Golden Pop   CHIEF COMPLAINT: Pulled his Foley catheter out.   HISTORY OF PRESENT ILLNESS: This is a 80 year old male from [place] home who was sent as the patient pulled his Foley catheter out. The patient is an extremely poor historian, so most of his history was obtained from the daughter over the phone, Miss Donella Stade, and from recent discharge in June of this year from [facility] The patient is known to have history of stage IV lung cancer with metastases to the brain status post brain tumor resection in January of this year. The patient was readmitted at [facility] in June of this year for worsening mental status and confusion where it was thought to be due to brain mets versus subacute infarct. Since then the patient has been discharged to the nursing home with gradual decline of functional capacity and progressive deconditioning. The patient presented to 2-3 times over the last week to the ED as he pulled his Foley catheter out as well. The patient had his Foley catheter out at the nursing home where they sent him to the ED. The ED staff could not reinsert the Foley catheter and so they had to call urologist on call, Dr. Bernardo Heater, who reinserted the Foley catheter and the patient had 250 mL urine output after the catheter insertion. The patient had multiple lab abnormalities, mainly was found to be in acute renal failure with creatinine of 1.7 with previous value at [facility] of 1.35. As well the patient was found to have anemia with hemoglobin level of 6.4, last value was 10 in June at [facility]. The patient was Hemoccult positive mildly. The patient just had a Tylenol suppository inserted prior to his Hemoccult exam.  As well the patient had significant hematuria. The  patient was also found to have a fever of 100.4. Blood cultures were sent and he was started on Rocephin as he had positive urinalysis. I discussed CODE STATUS with the daughter. She confirmed he is DO NOT RESUSCITATE/DO NOT INTUBATE as per nursing home documentation.  She does not want any aggressive or heroic measures to be done. I discussed with her the findings of Hemoccult-positive. She reports he had some abnormal findings on a CT scan before, as she was informed by the patient's oncologist, but at this point given the fact he has advanced lung cancer she does not want any further work-up for his positive occult stool.   PAST MEDICAL HISTORY:  1. Hyperlipidemia.  2. Tobacco abuse.  3. Hypertension.  4. Coronary artery disease.  5. Abdominal aortic aneurysm. 6. Stage IV lung cancer with brain metastasis status post surgery in January of this year.  7. Renal artery stenosis.  8. Renal insufficiency.  9. Peripheral artery disease.  10. 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  11. Adenocarcinoma status post right upper lobectomy two years ago.  12. Weakness.  13. Deconditioning.  14. Brain mets.  15. Chronic urinary retention with chronic indwelling Foley catheter.   PAST SURGICAL HISTORY:  1. Coronary stent placement.  2. Bronchoscopy 2011.  3. Right VATS with right upper lobectomy, mediastinal lymph node dissection in August 2011.  4. Left kidney stent placement.  5. Hemorrhoidectomy.  6. Craniectomy in January of this year.  SOCIAL HISTORY: Currently the patient lives in a nursing home, widowed. History of smoking. No alcohol or illicit drug use.   FAMILY HISTORY: Significant for breast cancer in his mother. Peripheral vascular disease in his sister.   ALLERGIES: Sulfa drugs.   HOME MEDICATIONS:  1. Tylenol 325, 2 tablets every four hours as needed.  2. Tekturna  300 mg daily.  3. Pantoprazole 40 mg daily.  4. Megestrol 40 mg 2 times a day.   5. Flomax 0.4 mg daily.  6. Lorazepam 0.5 mg at bedtime as needed.  7. Docusate 100 mg 2 times a day.  8. Crestor 10 mg at bedtime.  9. Coreg 25 mg 2 times a day.  10. Aspirin 81 mg daily.   REVIEW OF SYSTEMS: The patient is a poor historian and cannot give appropriate answers.  Attempted review of systems and it could not be obtained.   PHYSICAL EXAMINATION:  VITAL SIGNS: Temperature 100.4, T-max currently 98.7, pulse 87, respiratory rate 20, blood pressure 152/77, saturating 97% on room air.   GENERAL: Malnourished male, looks comfortable in bed in no apparent distress.   HEENT: Head atraumatic, normocephalic. Right eye status enucleation. Anicteric sclerae. Clear oropharynx, no lesions.  Mildly dry mucous membranes.  NECK: Supple. No thyromegaly. No JVD.    CARDIOVASCULAR: S1, S2 heard. No rubs, murmur, or gallop.   LUNGS: Good air entry bilaterally. No wheezing, rales, or rhonchi.   ABDOMEN: Soft, nontender, nondistended. Bowel sounds present.   EXTREMITIES: No cyanosis.  No edema.  SKIN: Had some mild senile purpura and has tattoos.   NEURO:  The patient had his head turned leftward with forward fixation. Has some mild left arm weakness which is old per reviewing his old documentation. Intact reflexes. Sensation intact.   PSYCHIATRIC: Unable to evaluate as he confused.   PERTINENT LABS: Glucose 122, BUN 33, creatinine 1.72, sodium 145, potassium 4.1, chloride 112, CO2 26, total protein 5.2, albumin 2.1. White blood cells 11.8, hemoglobin 6.4, hematocrit 19.4, platelets 325. Urinalysis showing red blood cells 1,318, white blood cells 56,  leukocyte esterase positive. INR 1.2.    ASSESSMENT AND PLAN:  1. Urinary retention: Foley was reinserted by Dr. Bernardo Heater from urology service. The patient currently has good urine output. We will monitor urine output.  2. Acute renal failure: Most likely due to urinary retention and volume depletion. Continue with fluids. We will check  basic metabolic panel in the a.m.  3. Anemia: The patient has significant drop in his hemoglobin from 10 to 6.4. This appears to be multifactorial, most likely mainly anemia of chronic disease as well as due to significant hematuria, as well the patient was Hemoccult positive. This was discussed in detail with the daughter. She reports he had some abnormal findings before in his colon as she was notified by his oncologist, but she does not wish for any further work-up at this point given the fact of his advanced lung cancer. She agrees to transfusing 2 units as this might cause improvement of his fatigue symptoms.  4. Fever and leukocytosis: This is most likely related to urinary tract infection. Blood cultures were sent. We will start on Rocephin.  5. Stage IV lung cancer: The patient is following with oncology in [place]. Currently no treatment.  6. Coronary artery disease:  No complaints of chest pain or shortness of breath. We will hold aspirin secondary to #1. 7. Hypertension: We will hold Tekturna and we will continue with Coreg.  8. Deep vein thrombosis prophylaxis:  Sequential compression device.  9. GI prophylaxis: Continue with Protonix.  10. CODE STATUS: DO NOT RESUSCITATE / DO NOT INTUBATE.   TOTAL TIME SPENT ON ADMISSION AND PATIENT CARE: 55 minutes.   ____________________________ Albertine Patricia, MD dse:bjt D: 04/28/2012 02:50:24 ET T: 04/28/2012 08:01:27 ET JOB#: [id number]  cc: Albertine Patricia, MD, <Dictator> Guadalupe Maple, MD Yvone Slape Graciela Husbands MD ELECTRONICALLY SIGNED 04/28/2012 22:19

## 2014-12-20 NOTE — Discharge Summary (Signed)
PATIENT NAME:  [name], [name] MR#:  [id number] DATE OF BIRTH:  Jan 28, 1935  DATE OF ADMISSION:  04/28/2012 DATE OF DISCHARGE:  04/30/2012  DISPOSITION:  Discharged to [facility] with Hospice.  PRESENTING COMPLAINT: Pulled his Foley catheter out.  DISCHARGE DIAGNOSES:  1. Severe anemia, etiology unclear. The patient's family does not want further work-up given poor long-term prognosis. The patient is status post 2-unit blood transfusion. Hemoglobin 9.4.  2. Chronic indwelling Foley catheter due to retention. Replaced by Dr. Bernardo Heater. 3. Stage IV lung cancer with mets. No further treatment per family request.  4. Acute renal failure. Received IV fluids.  5. Positive blood culture, methicillin-resistant Staphylococcus aureus. Source  appears to be urine.  The patient will get antibiotic in the form of linezolid for seven days.  6. Coronary artery disease.  7. Severe dementia.  8. CODE STATUS: NO CODE, DO NOT RESUSCITATE.   DISCHARGE MEDICATIONS:  1. Nicotine patch 14 mg transdermal daily.  2. Zyvox 600 b.i.d. for seven days.  3. Roxanol 20 mg/mL, 1 to 2 mg p.o. sublingual q. 4 p.r.n.  4. Docusate 100 mg b.i.d. p.r.n.  5. Senokot 1 tablet p.o. b.i.d. p.r.n.  6. Flomax 0.4 mg at bedtime.  7. Ativan 0.5 mg at bedtime p.r.n.  8. Crestor 10 mg at bedtime.  9. Megace 40 mg b.i.d.  10. Protonix 40 mg p.o. daily.  11. Coreg 25 mg b.i.d.     CONSULTS:  1. Palliative care, Dr. Ermalinda Memos.  2. Urology, Dr. Bernardo Heater for Foley placement.   HISTORY OF PRESENT ILLNESS: [name] is a 80 year old Caucasian gentleman with past medical history of stage IV lung cancer, urinary retention with indwelling Foley catheter, chronic kidney disease, and anemia, who came into the Emergency Room with: 1. The patient accidentally pulled out his Foley catheter. This patient has chronic urinary retention and an indwelling Foley catheter. The Foley was replaced by Dr. Bernardo Heater. The patient was noted  to have  MRSA in his urine and initially was started on IV vancomycin, however, now changed to p.o. linezolid to complete a 7 to 10 day course of treatment.  2. Positive blood cultures. MRSA source appears to be urine. The patient will complete antibiotic course with linezolid for 7 to 10 days. He is afebrile. White count is normal.  3. Anemia. It was noted that the patient's hemoglobin had dropped down to 6. No obvious source of bleeding was noted. Could be GI and per daughter possible GI malignancy found on PET scan which was recent. No details were available on it. The daughter did not want the patient to undergo further aggressive work-up given his poor prognosis. Hence the patient received 2 units of blood transfusion and hemoglobin is up at 9.4.  4. Acute renal failure due to dehydration, improved with IV fluids.  5. Severe dementia.  6. Palliative care consultation was obtained to help with goals and treatment given the patient's several comorbidities and metastatic lung cancer. After discussion with the daughter, she is agreeable for the patient to return back to [facility] with Hospice services, which has been arranged.  Hospital stay otherwise remained stable.   TIME SPENT: 40 minutes.  ____________________________ Hart Rochester Posey Pronto, MD sap:bjt D: 04/30/2012 11:43:29 ET T: 04/30/2012 12:46:41 ET JOB#: [id number]  cc: Zosia Lucchese A. Posey Pronto, MD, <Dictator> Efraim Kaufmann, MD Ronda Fairly. Bernardo Heater, MD Ilda Basset MD ELECTRONICALLY SIGNED 05/05/2012 15:45

## 2014-12-20 NOTE — Consult Note (Signed)
PATIENT NAME:  [name], [name] MR#:  [id number] DATE OF BIRTH:  01-21-1935  DATE OF CONSULTATION:  04/28/2012  REFERRING PHYSICIAN:   Dr. Mariea Clonts CONSULTING PHYSICIAN:  Nicki Reaper C. Juliona Vales, MD  REASON FOR CONSULTATION:  Urinary retention, inability to place Foley catheter.   HISTORY OF PRESENT ILLNESS: 80 year old male who resides in a skilled nursing facility. He has a diagnosis of urinary retention and has been managed with an indwelling Foley catheter. He pulled his catheter out on 08/17 and was seen in the Emergency Department for replacement of the Foley. He apparently again pulled the catheter out today. Emergency Room staff was unable to insert the Foley. He was then given a chance to void and no urine output has been noted. Temperature was 100.4 degrees. He is being admitted for further evaluation and catheter placement has been requested.   PAST MEDICAL HISTORY:  1. Stage IV lung cancer.  2. Hypertension.  3. Coronary artery disease.  4. Urinary retention.  5. Anemia.   PAST SURGICAL HISTORY: Denies.  No past surgeries listed in the chart.   MEDICATIONS ON ADMISSION:  1. Tekturna 300 mg daily.  2. Pantoprazole 40 mg daily.  3. Megace 40 mg b.i.d.  4. Lorazepam 0.4 mg daily.  5. Flomax 0.4 mg daily.  6. Crestor 10 mg daily.  7. Carvedilol 25 mg daily.  8. ASA 81 mg daily.   ALLERGIES: Sulfa.   SOCIAL HISTORY: The patient resides at a skilled nursing facility. No present tobacco or alcohol use.   REVIEW OF SYSTEMS: GENERAL: Positive fever.  PULMONARY: Chronic cough. CARDIOVASCULAR: Hypertension, history of arrhythmia. NEURO: No history of stroke. HEME: Positive anemia.  The remainder of the review of systems otherwise noncontributory except as per the history of present illness.   PHYSICAL EXAMINATION:  VITAL SIGNS:  Temperature 100.4, pulse 107, blood pressure 156/74.   GENERAL: The patient is in no acute distress. There are slight lower extremity contractures.   ABDOMEN:  Soft. Bladder not palpable or percussible.   GU: Phallus is uncircumcised. Testes are descended bilaterally with mild tenderness. There is mild scrotal edema. Prostate examination was deferred at this time.  EXTREMITIES: No edema.   Napanoch catheter was duplicated.  External genitalia were prepped in the usual fashion. A 14 French catheter was easily inserted without resistance, however would not irrigate and there was no urine return. This was felt consistent with a false passage.   IMPRESSION:  1. Urinary retention- chronic.  2. Urethral false passage.   PLAN:  Bedside cystoscopy. Refer to the separate note.   ____________________________ Ronda Fairly. Bernardo Heater, MD scs:bjt D: 04/28/2012 01:10:04 ET T: 04/28/2012 09:20:51 ET JOB#: [id number]  cc: Nicki Reaper C. Bernardo Heater, MD, <Dictator> Abbie Sons MD ELECTRONICALLY SIGNED 05/08/2012 12:10

## 2014-12-20 NOTE — Op Note (Signed)
PATIENT NAME:  [name], [name] MR#:  [id number] DATE OF BIRTH:  01/13/1935  DATE OF PROCEDURE:  04/28/2012  PREOPERATIVE DIAGNOSES:  1. Chronic urinary retention.  2. Urethral false passage with inability to place Foley.   POSTOPERATIVE DIAGNOSES:  1. Chronic urinary retention.  2. Urethral false passage with inability to place Foley.   PROCEDURE: Cystoscopy with catheter placement over a wire.   LOCATION: Emergency Department.   DESCRIPTION OF PROCEDURE: The patient's external genitalia were prepped and draped in the usual fashion. A flexible cystoscope was lubricated and passed under direct vision. The penile urethra was normal. Within the bulbar urethra a blood clot was noted and a false passage was identified. The scope was advanced following the normal anatomy. The prostate was remarkable for moderate lateral lobe enlargement. The cystoscope was advanced into the bladder. Bladder visualization was suboptimal. A 0.035 guidewire was placed through the cystoscope and into the bladder. The cystoscope was removed. A 16 French Councill catheter was easily placed over the wire with return of slightly cloudy urine. 10 mL of sterile water were instilled in the balloon and the catheter was secured to gravity drainage.   RECOMMENDATION: The patient has pulled his Foley out twice within the past two weeks. If some type of the mittens could be placed to try and prevent him from pulling out the catheter further would be beneficial.     ____________________________ Ronda Fairly. Bernardo Heater, MD scs:bjt D: 04/28/2012 01:13:10 ET T: 04/28/2012 09:34:35 ET JOB#: [id number]  cc: Nicki Reaper C. Bernardo Heater, MD, <Dictator> Abbie Sons MD ELECTRONICALLY SIGNED 05/08/2012 12:11

## 2014-12-20 NOTE — Consult Note (Signed)
Comments   Met with pt's daughter. She says patient has been declining some over the past several months. He is still ambulatory with a walker/assistance and will take himself outside to smoke. However, staff and family have concerns over falls and gait instability. They have actually just taken away pt's walker and encouraged use of wheelchair. Unfortunately patient is still attempting to ambulate self to the bathroom and when doing this his foley will hang on the bed leading to dislodgement of the catheter on several occasions. He is reportedly no longer able to work with physical therapy and just transitioned last week to long-term care at [facility]. Daughter says patient has had fluctuating weight loss over past 6-8 months. He weighed 134lbs in 08/2012 and has dropped as low as 109lbs recently. Pt currently weighs 117lbs with BMI 16. Pt is chronically confused.  updated on current medical status including likely GI bleed. She says that patient has no recent oncology followup as he no longer was a candidate nor wanted radiation or chemo. She indicates the last PET scan (Jan or Feb 2013) showed involvement in the colon and questions if this could be the etiology of current bleed. However, daughter does not see the point in an aggressive workup including luminal eval if there is nothing that could be done and patient had refused GI workup in the past. would be appropriate for hospice at [facility] which daughter agrees would help. Will order hospice screening. She confirms DNR.  talked about unpredictable course of GIB and if she would want future hospitalizations for transfusion. She will be traveling out of the country next week and would want stabilization during that period if indicated. However it seems she would be agreeable with less aggresssive care after that point. Will defer to hospice to assist with these discussions after discharge.  dx: lung cancer    Electronic Signatures: Crosby Bevan, Kirt Boys (NP)   (Signed 27-Aug-13 10:17)  Authored: Palliative Care Phifer, Izora Gala (MD)  (Signed 27-Aug-13 20:56)  Authored: Palliative Care   Last Updated: 27-Aug-13 20:56 by Phifer, Izora Gala (MD)

## 2020-04-05 ENCOUNTER — Other Ambulatory Visit: Payer: Self-pay | Admitting: Orthopedic Surgery
